# Patient Record
Sex: Female | Born: 1990
Health system: Southern US, Community
[De-identification: ages and names within clinical notes are randomized; demographics above are authoritative.]

## PROBLEM LIST (undated history)

## (undated) DIAGNOSIS — B009 Herpesviral infection, unspecified: Secondary | ICD-10-CM

## (undated) DIAGNOSIS — O4400 Placenta previa specified as without hemorrhage, unspecified trimester: Secondary | ICD-10-CM

## (undated) DIAGNOSIS — F32A Depression, unspecified: Secondary | ICD-10-CM

## (undated) DIAGNOSIS — Z789 Other specified health status: Secondary | ICD-10-CM

## (undated) HISTORY — DX: Complete placenta previa nos or without hemorrhage, unspecified trimester: O44.00

## (undated) HISTORY — DX: Herpesviral infection, unspecified: B00.9

---
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
## 2009-01-24 ENCOUNTER — Other Ambulatory Visit: Admission: RE | Admit: 2009-01-24 | Discharge: 2009-01-24 | Payer: Self-pay | Admitting: Obstetrics and Gynecology

## 2009-02-11 IMAGING — US US PELVIS COMPLETE MODIFY
1 series · 13 of 25 positions shown · non-contrast
Comparison: None.

CLINICAL DATA: Pelvic pain.  Three months postpartum. Patient has Juancho Goddard IUD. 
TRANSABDOMINAL AND TRANSVAGINAL PELVIC ULTRASOUND:
TECHNIQUE: Both transabdominal and transvaginal ultrasound examinations of the pelvis were performed including evaluation of the uterus, ovaries, adnexal regions, and pelvic cul-de-sac.

[Series 1: us pelvis complete modify · 0.24mm/px · 13 of 57 slices shown]
[im 1/57]
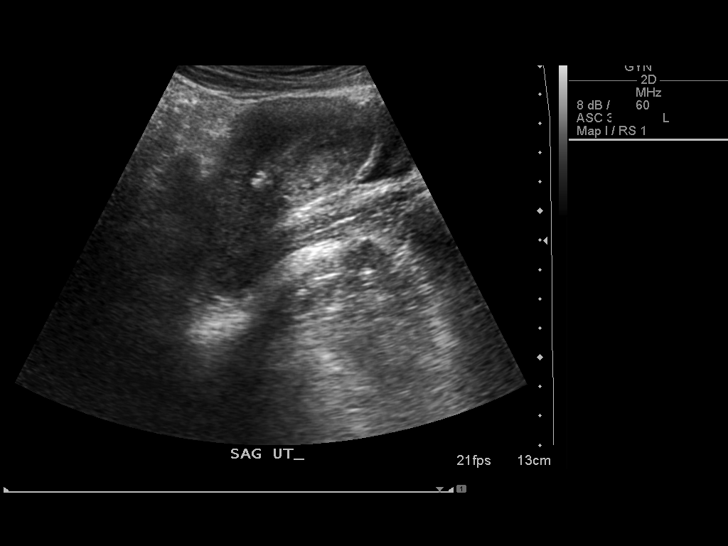
[im 5/57]
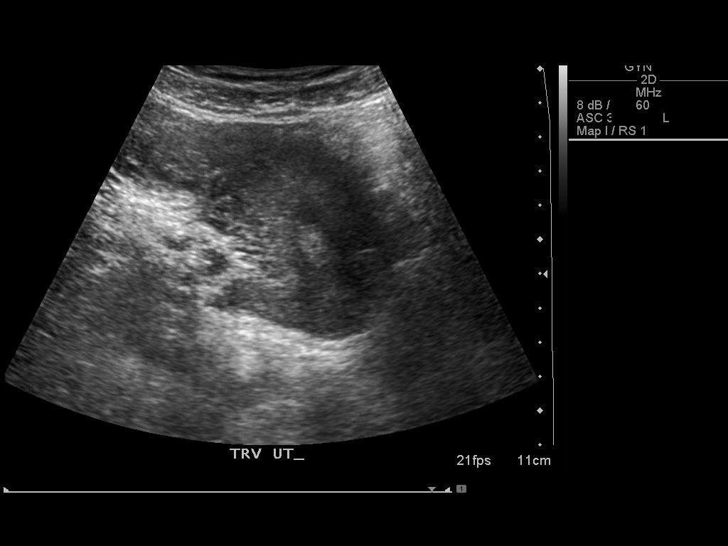
[im 10/57]
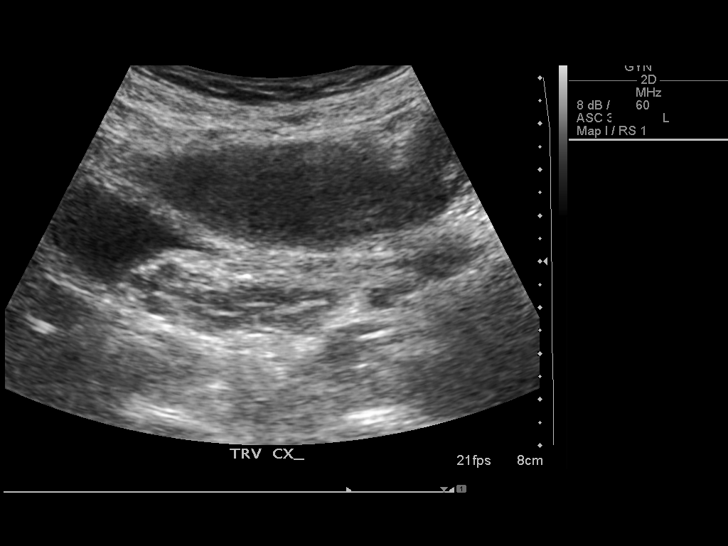
[im 15/57]
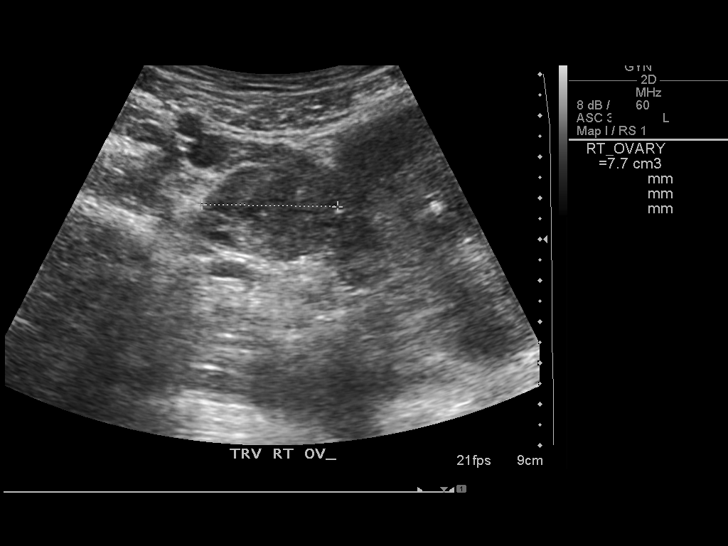
[im 19/57]
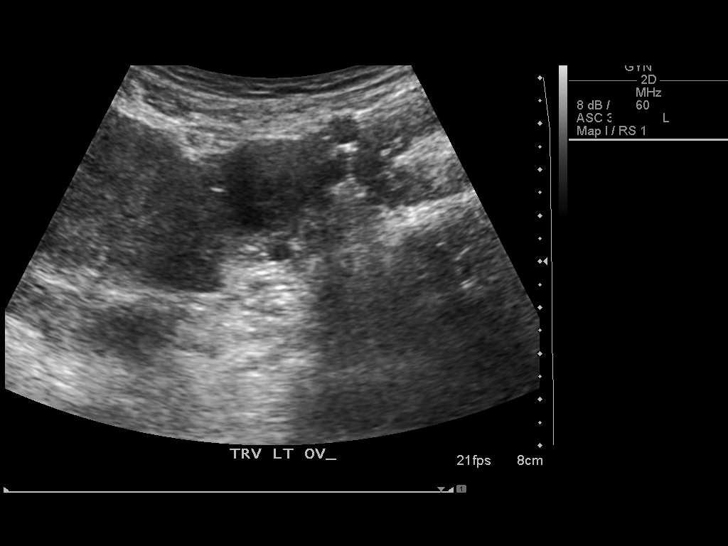
[im 24/57]
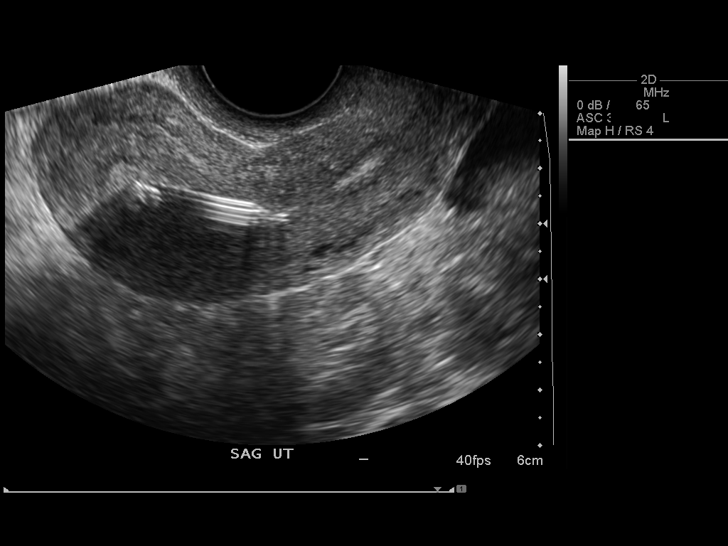
[im 29/57]
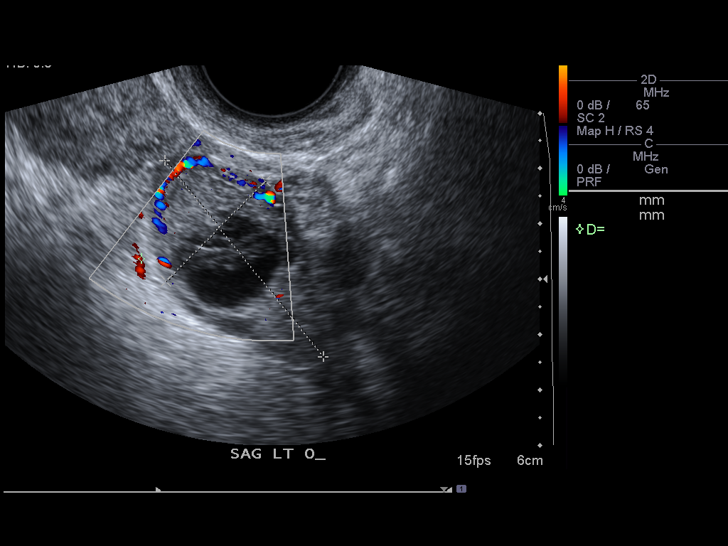
[im 33/57]
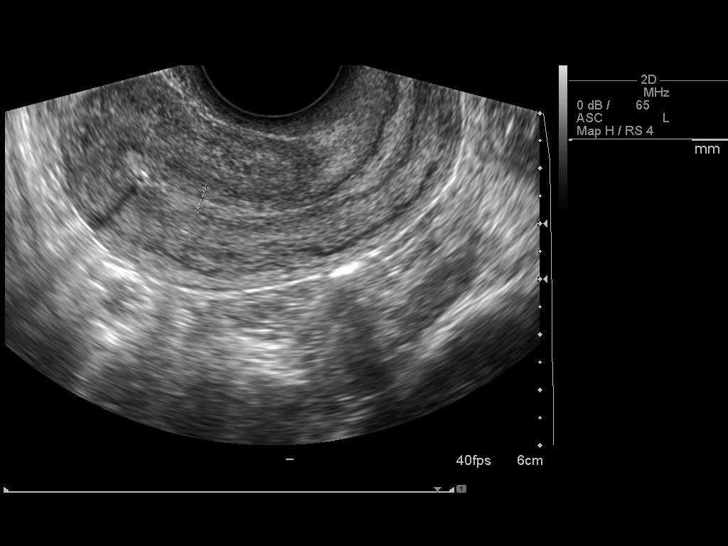
[im 38/57]
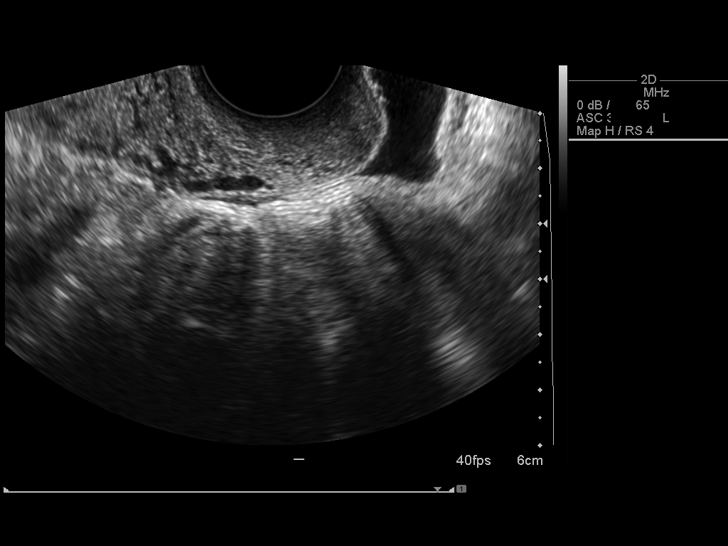
[im 43/57]
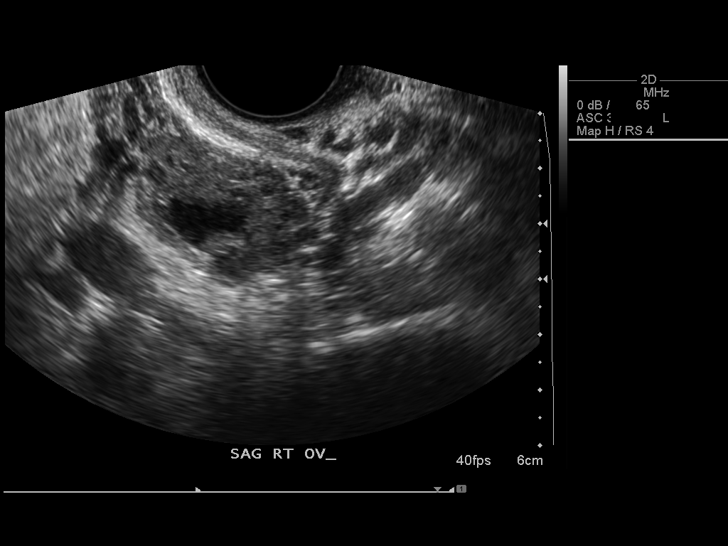
[im 47/57]
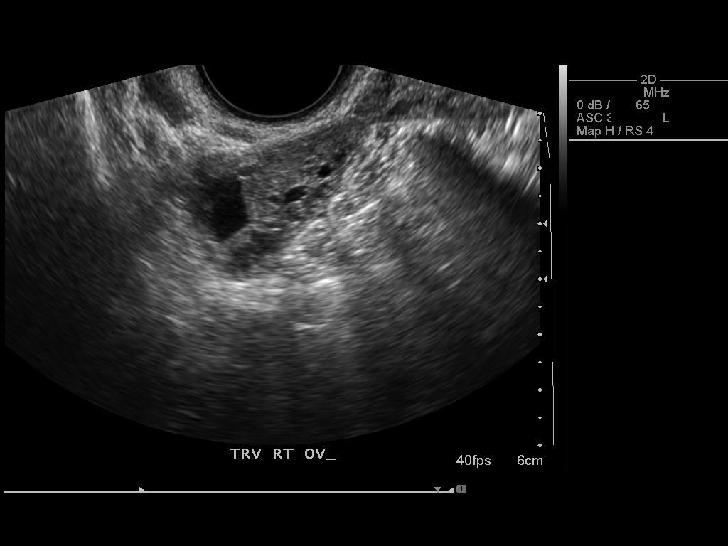
[im 52/57]
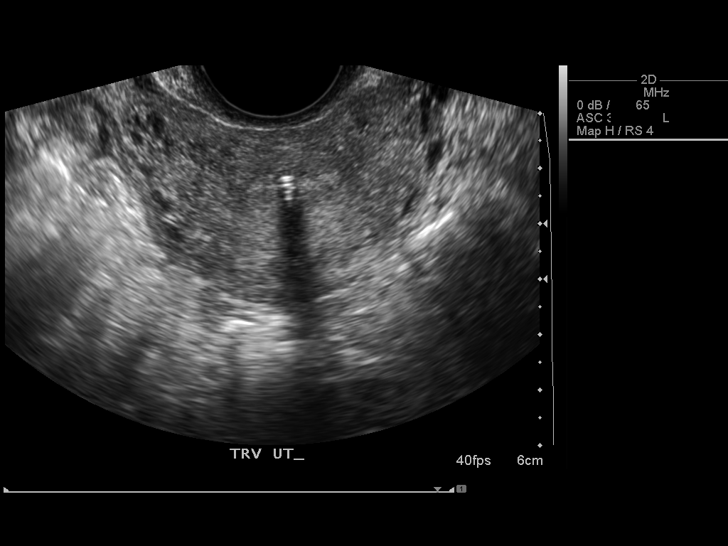
[im 57/57]
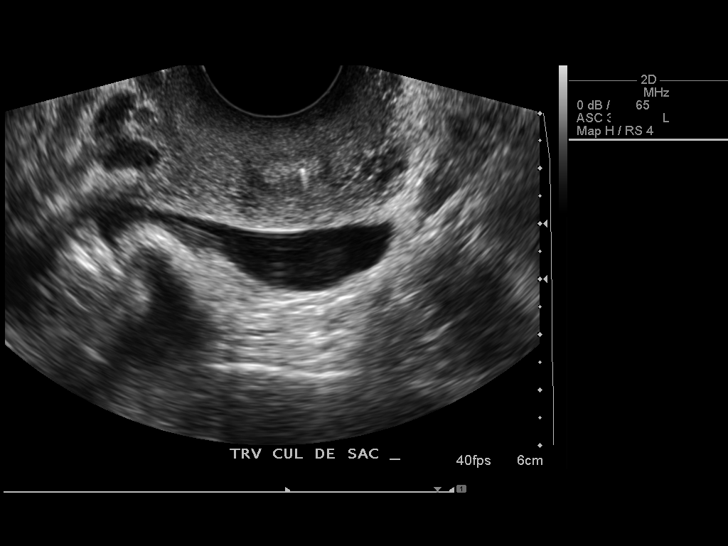

[13 of 25 positions shown; findings below may reference images not displayed]

FINDINGS: The uterus measures 8.3 cm in length x 4.4 cm AP diameter x 4.6 cm in width.  An intrauterine device is in satisfactory position in the endometrial canal.   
The endometrium measures 4 mm in greatest thickness.
The left ovary measures 4.6 x 2.6 x 2.8 cm and contains a 1.9 x 1.7 x 2.1 cm cyst with several internal Keen appearing septations, most consistent with a hemorrhagic cyst.  Doppler flow is seen within the surrounding ovarian tissue, without evidence for torsion.
The right ovary is 3.3 x 2.1 x 3.0 cm and has normal sonographic appearances. 
There is a small amount of simple appearing free fluid in the cul-de-sac.
IMPRESSION: 1.  2.1 x 1.9 x 1.7 cm hemorrhagic cyst in the left ovary may account for the patient?s pain.
2.  Small amount of free fluid in the cul-de-sac.
3.  Intrauterine device appears satisfactorily positioned within the endometrial canal.

## 2010-03-30 ENCOUNTER — Encounter: Payer: Self-pay | Admitting: Obstetrics & Gynecology
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
## 2010-07-25 NOTE — Discharge Summary (Signed)
[name], [name]            ACCOUNT NO.:  [id number]   MEDICAL RECORD NO.:  [id number]          PATIENT TYPE:  INP   LOCATION:  9122                          FACILITY:  [facility]   PHYSICIAN:  James A. Ashley Royalty, M.D.DATE OF BIRTH:  April 22, 1990   DATE OF ADMISSION:  09/10/2006  DATE OF DISCHARGE:  09/12/2006                               DISCHARGE SUMMARY   DISCHARGE DIAGNOSES:  1. Intrauterine pregnancy at 52 weeks' gestation, delivered.  2. Group B strep carrier.   OPERATIONS AND PROCEDURES:  OB delivery, episiotomy, episiorrhaphy.   CONSULTATIONS:  None.   DISCHARGE MEDICATIONS:  1. Percocet.  2. Motrin 600 mg.  3. Prenatal vitamins.   HISTORY AND PHYSICAL:  A 20 year old primigravida at 82 weeks' gestation  with O negative blood type.  The patient presented complaining of  contractions.  Initial cervical examination revealed a dilatation of 3  to 4 cm.  For the remainder of history and physical, please see chart.   HOSPITAL COURSE:  The patient was admitted to [facility].  Admission laboratory studies were drawn.  She was given  antibiotic prophylaxis versus GBS.  She went on to labor and deliver on  September 10, 2006.  The infant was a 7-pound 5-ounce female.  Apgars 8 at one  minute and 9 at five minutes, sent to the newborn nursery.  Delivery was  accomplished by Dr. Sylvester Harder over a second-degree midline  episiotomy with a Kiwi vacuum extractor secondary to inadequate  expulsive effort.  There were no complications.  The patient was  followed postpartum and was felt to be stable for discharge on September 12, 2006.  She was discharged home on that date afebrile and in satisfactory  condition.   DISPOSITION:  The patient is to return to [facility] and  Obstetrics in 4-6 weeks for postpartum evaluation.      James A. Ashley Royalty, M.D.  Electronically Signed     JAM/MEDQ  D:  11/11/2006  T:  11/11/2006  Job:  [id number]
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
## 2010-12-23 LAB — CBC
HCT: 33.7 — ABNORMAL LOW
Hemoglobin: 10.3 — ABNORMAL LOW
MCHC: 34.9
MCHC: 35
MCV: 88.7
Platelets: 208
RBC: 3.3 — ABNORMAL LOW
RDW: 13.6
WBC: 15.8 — ABNORMAL HIGH

## 2010-12-23 LAB — TYPE AND SCREEN
ABO/RH(D): O NEG
Antibody Screen: POSITIVE
Weak D: NEGATIVE

## 2011-04-04 ENCOUNTER — Encounter (HOSPITAL_COMMUNITY): Payer: Self-pay

## 2011-04-04 ENCOUNTER — Emergency Department (HOSPITAL_COMMUNITY)
Admission: EM | Admit: 2011-04-04 | Discharge: 2011-04-04 | Disposition: A | Discharge status: Home / Self Care | Payer: BC Managed Care – PPO | Attending: Emergency Medicine | Admitting: Emergency Medicine

## 2011-04-04 DIAGNOSIS — N390 Urinary tract infection, site not specified: Secondary | ICD-10-CM

## 2011-04-04 DIAGNOSIS — A6000 Herpesviral infection of urogenital system, unspecified: Secondary | ICD-10-CM | POA: Insufficient documentation

## 2011-04-04 DIAGNOSIS — F17200 Nicotine dependence, unspecified, uncomplicated: Secondary | ICD-10-CM | POA: Insufficient documentation

## 2011-04-04 HISTORY — DX: Herpesviral infection, unspecified: B00.9

## 2011-04-04 LAB — URINALYSIS, ROUTINE W REFLEX MICROSCOPIC
Glucose, UA: 100 mg/dL — AB
Nitrite: POSITIVE — AB
Protein, ur: 100 mg/dL — AB
Specific Gravity, Urine: >1.03 — ABNORMAL HIGH (ref 1.005–1.030)
Urobilinogen, UA: >8 mg/dL — ABNORMAL HIGH (ref 0.0–1.0)
pH: 5 (ref 5.0–8.0)

## 2011-04-04 LAB — WET PREP, GENITAL: Yeast Wet Prep HPF POC: NONE SEEN

## 2011-04-04 LAB — URINE MICROSCOPIC-ADD ON

## 2011-04-04 LAB — POCT PREGNANCY, URINE: Preg Test, Ur: NEGATIVE

## 2011-04-04 MED ORDER — NITROFURANTOIN MONOHYD MACRO 100 MG PO CAPS: 100.0000 mg | ORAL_CAPSULE | Freq: Two times a day (BID) | ORAL | Status: AC

## 2011-04-04 MED ORDER — VALACYCLOVIR HCL 1 G PO TABS: 1000.0000 mg | ORAL_TABLET | Freq: Two times a day (BID) | ORAL | Status: DC

## 2011-04-04 NOTE — ED Provider Notes (Signed)
History     CSN: [id number]  Arrival date & time 04/04/11  1023   First MD Initiated Contact with Patient 04/04/11 1034      Chief Complaint  Patient presents with  . Urinary Tract Infection    (Consider location/radiation/quality/duration/timing/severity/associated sxs/prior treatment) HPI Comments: Patient with a history of herpes simplex presents with 3 day history of dysuria and pain - states when she was intiially diagnosed her lesions were mainly on the inside - has run out of her valtrex - is unsure if this is recurrence of herpes or an UTI  Patient is a 21 y.o. female presenting with female genitourinary complaint. The history is provided by the patient. No language interpreter was used.  Female GU Problem Primary symptoms include genital lesions, genital pain and dysuria.  Primary symptoms include no discharge, no pelvic pain, no dyspareunia, no genital rash, no genital itching, no genital odor and no vaginal bleeding. There has been no fever. This is a recurrent problem. The current episode started more than 1 week ago. The problem occurs constantly. The problem has been gradually worsening. The symptoms occur during urination and at rest. She is not pregnant. She has not missed her period. Her LMP was days ago. The patient's menstrual history has been regular. The discharge was clear. Pertinent negatives include no anorexia, no diaphoresis, no abdominal swelling, no abdominal pain, no constipation, no diarrhea, no nausea, no vomiting, no frequency, no light-headedness and no dizziness. She has tried nothing for the symptoms. The treatment provided no relief. Sexual activity: sexually active. There is a concern regarding sexually transmitted diseases. She uses oral contraceptives for contraception. Associated medical issues include Herpes simplex.    Past Medical History  Diagnosis Date  . Herpes     History reviewed. No pertinent past surgical history.  No family history on  file.  History  Substance Use Topics  . Smoking status: Current Everyday Smoker  . Smokeless tobacco: Not on file  . Alcohol Use: Yes     occasionally    OB History    Grav Para Term Preterm Abortions TAB SAB Ect Mult Living                  Review of Systems  Constitutional: Negative for diaphoresis.  Gastrointestinal: Negative for nausea, vomiting, abdominal pain, diarrhea, constipation and anorexia.  Genitourinary: Positive for dysuria, hematuria and genital sores. Negative for frequency, flank pain, decreased urine volume, vaginal bleeding, vaginal pain, pelvic pain and dyspareunia.  Neurological: Negative for dizziness and light-headedness.  All other systems reviewed and are negative.    Allergies  Penicillins  Home Medications  No current outpatient prescriptions on file.  BP 129/67  Pulse 106  Temp(Src) 98.2 F (36.8 C) (Oral)  Resp 20  Ht 5\' 5"  (1.651 m)  Wt 124 lb (56.246 kg)  BMI 20.63 kg/m2  SpO2 100%  LMP 03/14/2011  Physical Exam  Nursing note and vitals reviewed. Constitutional: She is oriented to person, place, and time. She appears well-developed and well-nourished. No distress.  HENT:  Head: Normocephalic and atraumatic.  Right Ear: External ear normal.  Left Ear: External ear normal.  Nose: Nose normal.  Mouth/Throat: Oropharynx is clear and moist. No oropharyngeal exudate.  Eyes: Conjunctivae are normal. Pupils are equal, round, and reactive to light. No scleral icterus.  Neck: Normal range of motion. Neck supple. No thyromegaly present.  Cardiovascular: Normal rate, regular rhythm and normal heart sounds.  Exam reveals no gallop and no friction rub.  No murmur heard. Pulmonary/Chest: Effort normal and breath sounds normal. She exhibits no tenderness.  Abdominal: Soft. Bowel sounds are normal. She exhibits no distension. There is no tenderness.  Genitourinary: Vagina normal and uterus normal. Pelvic exam was performed with patient supine.  There is no rash, tenderness or lesion on the right labia. There is no rash, tenderness or lesion on the left labia. Uterus is not tender. Cervix exhibits friability. Cervix exhibits no motion tenderness and no discharge. Right adnexum displays no mass and no tenderness. Left adnexum displays no mass and no tenderness. No erythema or tenderness around the vagina. No vaginal discharge found.    Musculoskeletal: Normal range of motion.  Lymphadenopathy:    She has no cervical adenopathy.  Neurological: She is alert and oriented to person, place, and time.  Skin: Skin is warm and dry. No rash noted. No erythema. No pallor.  Psychiatric: She has a normal mood and affect. Her behavior is normal. Judgment and thought content normal.    ED Course  Procedures (including critical care time)   Labs Reviewed  POCT PREGNANCY, URINE  URINALYSIS, ROUTINE W REFLEX MICROSCOPIC  GC/CHLAMYDIA PROBE AMP, GENITAL  WET PREP, GENITAL   No results found.  Results for orders placed during the hospital encounter of 04/04/11  URINALYSIS, ROUTINE W REFLEX MICROSCOPIC      Component Value Range   Color, Urine RED (*) YELLOW    APPearance HAZY (*) CLEAR    Specific Gravity, Urine >1.030 (*) 1.005 - 1.030    pH 5.0  5.0 - 8.0    Glucose, UA 100 (*) NEGATIVE (mg/dL)   Hgb urine dipstick LARGE (*) NEGATIVE    Bilirubin Urine MODERATE (*) NEGATIVE    Ketones, ur TRACE (*) NEGATIVE (mg/dL)   Protein, ur 161 (*) NEGATIVE (mg/dL)   Urobilinogen, UA >0.9 (*) 0.0 - 1.0 (mg/dL)   Nitrite POSITIVE (*) NEGATIVE    Leukocytes, UA TRACE (*) NEGATIVE   POCT PREGNANCY, URINE      Component Value Range   Preg Test, Ur NEGATIVE  NEGATIVE   URINE MICROSCOPIC-ADD ON      Component Value Range   Squamous Epithelial / LPF FEW (*) RARE    WBC, UA 21-50  <3 (WBC/hpf)   RBC / HPF TOO NUMEROUS TO COUNT  <3 (RBC/hpf)   Bacteria, UA MANY (*) RARE    No results found.  Herpes Simplex UTI    MDM  Patient with history  of HS2 with recurrence of same - not on antiviral medication at this time - also with UTI        Scarlette Calico C. [place], [place] 04/04/11 1135

## 2011-04-04 NOTE — ED Notes (Signed)
Pt says thinks has a UTI.  Pt says found out in July that she has Herpes simplex.  Pt says Health dept only gave her 4 months supply of acycolvir and ran out 2 weeks ago.  Pt says she does better with valtrex.   Pt c/o lower back pain and burning with urination x 3 days.

## 2011-04-06 LAB — URINE CULTURE
Colony Count: >=100000
Culture  Setup Time: 201301262056
Special Requests: NORMAL

## 2011-04-06 LAB — GC/CHLAMYDIA PROBE AMP, GENITAL: Chlamydia, DNA Probe: NEGATIVE

## 2011-04-06 NOTE — ED Provider Notes (Signed)
Medical screening examination/treatment/procedure(s) were performed by non-physician practitioner and as supervising physician I was immediately available for consultation/collaboration.  Davontay Watlington, MD 04/06/11 1046 

## 2020-03-09 NOTE — L&D Delivery Note (Signed)
       Delivery Note   [name] is a 30 y.o. G3P2002 at [redacted]w[redacted]d Estimated Date of Delivery: 02/17/21  PRE-OPERATIVE DIAGNOSIS:  1) [redacted]w[redacted]d pregnancy.    POST-OPERATIVE DIAGNOSIS:  1) [redacted]w[redacted]d pregnancy s/p Vaginal, Spontaneous  2) Viable female infant  Delivery Type: Vaginal, Spontaneous    Delivery Anesthesia: Epidural   Labor Complications:      ESTIMATED BLOOD LOSS: 2mL   FINDINGS:   1) female infant, Apgar scores of    at 1 minute and    at 5 minutes and a birthweight of   ounces.    2) Nuchal cord: No  SPECIMENS:   PLACENTA:   Appearance: Intact    Removal: Spontaneous      Disposition:    DISPOSITION:  Infant to left in stable condition in the delivery room, with L&D personnel and mother,  NARRATIVE SUMMARY: Labor course:  [name] is a [id number] at [redacted]w[redacted]d who presented for induction of labor. She received Misoprostol and underwent AROM.  She began contracting regularly and rapidly progressed.  She received the appropriate anesthesia and proceeded to complete dilation. She evidenced good maternal expulsive effort during the second stage. She went on to deliver a viable infant. The placenta delivered without problems and was noted to be complete. A perineal and vaginal examination was performed. Episiotomy/Lacerations:  None  Elonda Husky, M.D. 02/10/2021 2:41 PM

## 2020-06-24 ENCOUNTER — Encounter: Payer: Self-pay | Admitting: Obstetrics and Gynecology

## 2020-06-24 ENCOUNTER — Other Ambulatory Visit: Payer: Self-pay

## 2020-06-24 ENCOUNTER — Ambulatory Visit (INDEPENDENT_AMBULATORY_CARE_PROVIDER_SITE_OTHER): Payer: Medicaid Other | Admitting: Obstetrics and Gynecology

## 2020-06-24 VITALS — BP 116/66 | HR 74 | Ht 65.0 in | Wt 128.0 lb

## 2020-06-24 DIAGNOSIS — Z3200 Encounter for pregnancy test, result unknown: Secondary | ICD-10-CM | POA: Diagnosis not present

## 2020-06-24 DIAGNOSIS — N912 Amenorrhea, unspecified: Secondary | ICD-10-CM | POA: Diagnosis not present

## 2020-06-24 LAB — POCT URINE PREGNANCY: Preg Test, Ur: POSITIVE — AB

## 2020-06-24 NOTE — Progress Notes (Signed)
HPI:      [name] is a 30 y.o. [id number] who LMP was Patient's last menstrual period was 05/23/2020.  Subjective:   She presents today with complaint of amenorrhea.  Based on last menstrual period she is approximately 4 weeks estimated gestational age.  She has occasional nausea and vomiting.  She was not attempting pregnancy but not preventing.  She is not yet taking prenatal vitamins. Her 2 prior pregnancies were vaginal deliveries.  Her second pregnancy was complicated by placenta previa but this resolved by delivery.    Hx: The following portions of the patient's history were reviewed and updated as appropriate:             She  has a past medical history of HSV (herpes simplex virus) infection and Placenta previa. She does not have a problem list on file. She  has a past surgical history that includes Facial reconstruction surgery (2014). Her family history includes Dementia in her maternal grandfather; Diabetes in her paternal grandmother; Hypertension in her mother. She  reports that she has never smoked. She has never used smokeless tobacco. She reports previous alcohol use. She reports current drug use. Drug: Marijuana. She currently has no medications in their medication list. She is allergic to hydrocodone.       Review of Systems:  Review of Systems  Constitutional: Denied constitutional symptoms, night sweats, recent illness, fatigue, fever, insomnia and weight loss.  Eyes: Denied eye symptoms, eye pain, photophobia, vision change and visual disturbance.  Ears/Nose/Throat/Neck: Denied ear, nose, throat or neck symptoms, hearing loss, nasal discharge, sinus congestion and sore throat.  Cardiovascular: Denied cardiovascular symptoms, arrhythmia, chest pain/pressure, edema, exercise intolerance, orthopnea and palpitations.  Respiratory: Denied pulmonary symptoms, asthma, pleuritic pain, productive sputum, cough, dyspnea and wheezing.  Gastrointestinal: Denied,  gastro-esophageal reflux, melena, nausea and vomiting.  Genitourinary: Denied genitourinary symptoms including symptomatic vaginal discharge, pelvic relaxation issues, and urinary complaints.  Musculoskeletal: Denied musculoskeletal symptoms, stiffness, swelling, muscle weakness and myalgia.  Dermatologic: Denied dermatology symptoms, rash and scar.  Neurologic: Denied neurology symptoms, dizziness, headache, neck pain and syncope.  Psychiatric: Denied psychiatric symptoms, anxiety and depression.  Endocrine: Denied endocrine symptoms including hot flashes and night sweats.   Meds:   No current outpatient medications on file prior to visit.   No current facility-administered medications on file prior to visit.          Objective:     Vitals:   06/24/20 1056  BP: 116/66  Pulse: 74   Filed Weights   06/24/20 1056  Weight: 128 lb (58.1 kg)              Urinary beta-hCG positive  Assessment:    G3P2002 There are no problems to display for this patient.    1. Amenorrhea   2. Possible pregnancy, not confirmed     Approximately 4 weeks estimated gestational age based on LMP -patient not exactly sure of LMP.   Plan:            Prenatal Plan 1.  The patient was given prenatal literature. 2.  She was started on prenatal vitamins. 3.  A prenatal lab panel to be drawn at nurse visit. 4.  An ultrasound was ordered to better determine an EDC. 5.  A nurse visit was scheduled. 6.  Genetic testing and testing for other inheritable conditions discussed in detail. She will decide in the future whether to have these labs performed. 7.  A general overview of pregnancy testing,  visit schedule, ultrasound schedule, and prenatal care was discussed. 8.  COVID and its risks associated with pregnancy, prevention by limiting exposure and use of masks, as well as the risks and benefits of vaccination during pregnancy were discussed in detail.  [facility] policy regarding office and hospital  visitation and testing was explained. 9.  Benefits of breast-feeding discussed in detail including both maternal and infant benefits. Ready Set Baby website discussed.   Orders Orders Placed This Encounter  Procedures  . US OB Comp Less 14 Wks  . POCT urine pregnancy    No orders of the defined types were placed in this encounter.     F/U  Return in about 5 weeks (around 07/29/2020). I spent 32 minutes involved in the care of this patient preparing to see the patient by obtaining and reviewing her medical history (including labs, imaging tests and prior procedures), documenting clinical information in the electronic health record (EHR), counseling and coordinating care plans, writing and sending prescriptions, ordering tests or procedures and directly communicating with the patient by discussing pertinent items from her history and physical exam as well as detailing my assessment and plan as noted above so that she has an informed understanding.  All of her questions were answered.  Elonda Husky, M.D. 06/24/2020 11:21 AM

## 2020-07-03 ENCOUNTER — Telehealth: Payer: Self-pay | Admitting: Certified Nurse Midwife

## 2020-07-03 MED ORDER — DOXYLAMINE-PYRIDOXINE 10-10 MG PO TBEC: 10.0000 mg | DELAYED_RELEASE_TABLET | Freq: Every day | ORAL | 1 refills | Status: DC

## 2020-07-03 NOTE — Telephone Encounter (Signed)
Pt states she has been having n/v for several days. She can drink h20 and sprite.   Diclegis erxed. Pt aware to stay hydrated. Small frequent meals.   Pt voiced understanding.

## 2020-07-03 NOTE — Telephone Encounter (Signed)
New Message:  Pt states that she is sick and hasn't been able to keep any food down since last Thursday.  She states that she lost her sister that day.  She states that she has no strength to get out of bed and is due to go back to work Friday.

## 2020-07-04 ENCOUNTER — Telehealth: Payer: Self-pay | Admitting: Certified Nurse Midwife

## 2020-07-04 NOTE — Telephone Encounter (Signed)
[facility] has pts rx. Pharmacy needs pts insurance. Pt is aware.

## 2020-07-04 NOTE — Telephone Encounter (Signed)
[name] called and states that she went to the pharmacy to pick up her doxylamine-pyridoxine and the pharmacy states they never received a script from [place].  Patient states she showed them her mychart notification that it was sent in and they keep telling her it wasn't.  [employer] states they needed someone from our office to call them at [phone]

## 2020-07-26 ENCOUNTER — Telehealth: Payer: Self-pay | Admitting: Certified Nurse Midwife

## 2020-07-26 NOTE — Telephone Encounter (Signed)
VM not set up yet.

## 2020-07-26 NOTE — Telephone Encounter (Signed)
Spoke with pt and she stated that she has been taking the medication for nausea and is still very sick.  She wants to know if there is anything else she can try.

## 2020-07-29 ENCOUNTER — Encounter: Payer: Medicaid Other | Admitting: Certified Nurse Midwife

## 2020-07-29 ENCOUNTER — Other Ambulatory Visit: Payer: Self-pay

## 2020-07-29 ENCOUNTER — Ambulatory Visit (INDEPENDENT_AMBULATORY_CARE_PROVIDER_SITE_OTHER): Payer: Medicaid Other

## 2020-07-29 VITALS — BP 109/74 | HR 80 | Ht 65.0 in | Wt 123.5 lb

## 2020-07-29 DIAGNOSIS — Z3491 Encounter for supervision of normal pregnancy, unspecified, first trimester: Secondary | ICD-10-CM | POA: Diagnosis not present

## 2020-07-29 DIAGNOSIS — Z3481 Encounter for supervision of other normal pregnancy, first trimester: Secondary | ICD-10-CM

## 2020-07-29 DIAGNOSIS — Z113 Encounter for screening for infections with a predominantly sexual mode of transmission: Secondary | ICD-10-CM

## 2020-07-29 DIAGNOSIS — Z0283 Encounter for blood-alcohol and blood-drug test: Secondary | ICD-10-CM | POA: Diagnosis not present

## 2020-07-29 LAB — OB RESULTS CONSOLE GC/CHLAMYDIA: Gonorrhea: NEGATIVE

## 2020-07-29 LAB — OB RESULTS CONSOLE VARICELLA ZOSTER ANTIBODY, IGG: Varicella: IMMUNE

## 2020-07-29 MED ORDER — VITAFOL GUMMIES 3.33-0.333-34.8 MG PO CHEW: 3.0000 | CHEWABLE_TABLET | Freq: Every day | ORAL | 11 refills | Status: DC

## 2020-07-29 MED ORDER — ONDANSETRON HCL 4 MG PO TABS: 4.0000 mg | ORAL_TABLET | Freq: Three times a day (TID) | ORAL | 0 refills | Status: DC | PRN

## 2020-07-29 NOTE — Progress Notes (Signed)
      [name] presents for NOB nurse intake visit. Pregnancy confirmation done at [facility], 06/24/2020, with Linzie Collin, MD.  G 3.  P 2002.  LMP 05/23/2020.  EDD 02/27/2021.  [place] [redacted]w[redacted]d . Pregnancy education material explained and given.  0 cats in the home.  NOB labs ordered. BMI less than 30. TSH/HbgA1c not ordered. Sickle cell not ordered due to race. HIV and drug screen explained and ordered. Genetic screening discussed. Genetic testing;wants genetic testing. Pt to discuss genetic testing with provider. Will complete doing NOB PE with provider.  PNV encouraged. Pt to follow up with provider in  3 weeks for NOB physical. FMLA/[employer] Financial Policy/HIV/Drug screening forms all reviewed and signed by patient.

## 2020-07-29 NOTE — Patient Instructions (Signed)
WHAT OB PATIENTS CAN EXPECT   Confirmation of pregnancy and ultrasound ordered if medically indicated-[redacted] weeks gestation  New OB (NOB) intake with nurse and New OB (NOB) labs- [redacted] weeks gestation  New OB (NOB) physical examination with provider- 11/[redacted] weeks gestation  Flu vaccine-[redacted] weeks gestation  Anatomy scan-[redacted] weeks gestation  Glucose tolerance test, blood work to test for anemia, T-dap vaccine-[redacted] weeks gestation  Vaginal swabs/cultures-STD/Group B strep-[redacted] weeks gestation  Appointments every 4 weeks until 28 weeks  Every 2 weeks from 28 weeks until 36 weeks  Weekly visits from 36 weeks until delivery  How a Baby Grows During Pregnancy Pregnancy begins when a female's sperm enters a female's egg. This is called fertilization. Fertilization usually happens in one of the fallopian tubes that connect the ovaries to the uterus. The fertilized egg moves down the fallopian tube to the uterus. Once it reaches the uterus, it implants into the lining of the uterus and begins to grow. For the first 8 weeks, the fertilized egg is called an embryo. After 8 weeks, it is called a fetus. As the fetus continues to grow, it receives oxygen and nutrients through the placenta, which is an organ that grows to support the developing baby. The placenta is the life support system for the baby. It provides oxygen and nutrition and removes waste. How long does a typical pregnancy last? A pregnancy usually lasts 280 days, or about 40 weeks. Pregnancy is divided into three periods of growth, also called trimesters:  First trimester: 0-12 weeks.  Second trimester: 13-27 weeks.  Third trimester: 28-40 weeks. The day when your baby is ready to be born (full term) is your estimated date of delivery. However, most babies are not born on their estimated date of delivery. How does my baby develop month by month? First month  The fertilized egg attaches to the inside of the uterus.  Some cells will form the  placenta. Others will form the fetus.  The arms, legs, brain, spinal cord, lungs, and heart begin to develop.  At the end of the first month, the heart begins to beat. Second month  The bones, inner ear, eyelids, hands, and feet form.  The genitals develop.  By the end of 8 weeks, all major organs are developing. Third month  All of the internal organs are forming.  Teeth develop below the gums.  Bones and muscles begin to grow. The spine can flex.  The skin is transparent.  Fingernails and toenails begin to form.  Arms and legs continue to grow longer, and hands and feet develop.  The fetus is about 3 inches (7.6 cm) long. Fourth month  The placenta is completely formed.  The external sex organs, neck, outer ear, eyebrows, eyelids, and fingernails are formed.  The fetus can hear, swallow, and move its arms and legs.  The kidneys begin to produce urine.  The skin is covered with a white, waxy coating (vernix) and very fine hair (lanugo). Fifth month  The fetus moves around more and can be felt for the first time (quickening).  The fetus starts to sleep and wake up and may begin to suck a finger.  The nails grow to the end of the fingers.  The organ in the digestive system that makes bile (gallbladder) functions and helps to digest nutrients.  If the fetus is a female, eggs are present in the ovaries. If the fetus is a female, testicles start to move down into the scrotum. Sixth month  The  lungs are formed.  The eyes open. The brain continues to develop.  Your baby has fingerprints and toe prints. Your baby's hair grows thicker.  At the end of the second trimester, the fetus is about 9 inches (22.9 cm) long. Seventh month  The fetus kicks and stretches.  The eyes are developed enough to sense changes in light.  The hands can make a grasping motion.  The fetus responds to sound. Eighth month  Most organs and body systems are fully developed and  functioning.  Bones harden, and taste buds develop. The fetus may hiccup.  Certain areas of the brain are still developing. The skull remains soft. Ninth month  The fetus gains about  lb (0.23 kg) each week.  The lungs are fully developed.  Patterns of sleep develop.  The fetus's head typically moves into a head-down position (vertex) in the uterus to prepare for birth.  The fetus weighs 6-9 lb (2.72-4.08 kg) and is 19-20 inches (48.26-50.8 cm) long.   How do I know if my baby is developing well? Always talk with your health care provider about any concerns that you may have about your pregnancy and your baby. At each prenatal visit, your health care provider will do several different tests to check on your health and keep track of your baby's development. These include:  Fundal height and position. To do this, your health care provider will: ? Measure your growing belly from your pubic bone to the top of the uterus using a tape measure. ? Feel your belly to determine your baby's position.  Heartbeat. An ultrasound in the first trimester can confirm pregnancy and show a heartbeat, depending on how far along you are. Your health care provider will check your baby's heart rate at every prenatal visit. You will also have a second trimester ultrasound to check your baby's development. Follow these instructions at home:  Take prenatal vitamins as told by your health care provider. These include vitamins such as folic acid, iron, calcium, and vitamin D. They are important for healthy development.  Take over-the-counter and prescription medicines only as told by your health care provider.  Keep all follow-up visits. This is important. Follow-up visits include prenatal care and screening tests. Summary  A pregnancy usually lasts 280 days, or about 40 weeks. Pregnancy is divided into three periods of growth, also called trimesters.  Your health care provider will monitor your baby's  growth and development throughout your pregnancy.  Follow your health care provider's recommendations about taking prenatal vitamins and medicines during your pregnancy.  Talk with your health care provider if you have any concerns about your pregnancy or your developing baby. This information is not intended to replace advice given to you by your health care provider. Make sure you discuss any questions you have with your health care provider. Document Revised: 08/02/2019 Document Reviewed: 06/08/2019 Elsevier Patient Education  2021 Elsevier Inc. https://www.cdc.gov/pregnancy/infections.html">  First Trimester of Pregnancy  The first trimester of pregnancy starts on the first day of your last menstrual period until the end of week 12. This is also called months 1 through 3 of pregnancy. Body changes during your first trimester Your body goes through many changes during pregnancy. The changes usually return to normal after your baby is born. Physical changes  You may gain or lose weight.  Your breasts may grow larger and hurt. The area around your nipples may get darker.  Dark spots or blotches may develop on your face.  You may   have changes in your hair. Health changes  You may feel like you might vomit (nauseous), and you may vomit.  You may have heartburn.  You may have headaches.  You may have trouble pooping (constipation).  Your gums may bleed. Other changes  You may get tired easily.  You may pee (urinate) more often.  Your menstrual periods will stop.  You may not feel hungry.  You may want to eat certain kinds of food.  You may have changes in your emotions from day to day.  You may have more dreams. Follow these instructions at home: Medicines  Take over-the-counter and prescription medicines only as told by your doctor. Some medicines are not safe during pregnancy.  Take a prenatal vitamin that contains at least 600 micrograms (mcg) of folic  acid. Eating and drinking  Eat healthy meals that include: ? Fresh fruits and vegetables. ? Whole grains. ? Good sources of protein, such as meat, eggs, or tofu. ? Low-fat dairy products.  Avoid raw meat and unpasteurized juice, milk, and cheese.  If you feel like you may vomit, or you vomit: ? Eat 4 or 5 small meals a day instead of 3 large meals. ? Try eating a few soda crackers. ? Drink liquids between meals instead of during meals.  You may need to take these actions to prevent or treat trouble pooping: ? Drink enough fluids to keep your pee (urine) pale yellow. ? Eat foods that are high in fiber. These include beans, whole grains, and fresh fruits and vegetables. ? Limit foods that are high in fat and sugar. These include fried or sweet foods. Activity  Exercise only as told by your doctor. Most people can do their usual exercise routine during pregnancy.  Stop exercising if you have cramps or pain in your lower belly (abdomen) or low back.  Do not exercise if it is too hot or too humid, or if you are in a place of great height (high altitude).  Avoid heavy lifting.  If you choose to, you may have sex unless your doctor tells you not to. Relieving pain and discomfort  Wear a good support bra if your breasts are sore.  Rest with your legs raised (elevated) if you have leg cramps or low back pain.  If you have bulging veins (varicose veins) in your legs: ? Wear support hose as told by your doctor. ? Raise your feet for 15 minutes, 3-4 times a day. ? Limit salt in your food. Safety  Wear your seat belt at all times when you are in a car.  Talk with your doctor if someone is hurting you or yelling at you.  Talk with your doctor if you are feeling sad or have thoughts of hurting yourself. Lifestyle  Do not use hot tubs, steam rooms, or saunas.  Do not douche. Do not use tampons or scented sanitary pads.  Do not use herbal medicines, illegal drugs, or medicines  that are not approved by your doctor. Do not drink alcohol.  Do not smoke or use any products that contain nicotine or tobacco. If you need help quitting, ask your doctor.  Avoid cat litter boxes and soil that is used by cats. These carry germs that can cause harm to the baby and can cause a loss of your baby by miscarriage or stillbirth. General instructions  Keep all follow-up visits. This is important.  Ask for help if you need counseling or if you need help with nutrition. Your doctor   can give you advice or tell you where to go for help.  Visit your dentist. At home, brush your teeth with a soft toothbrush. Floss gently.  Write down your questions. Take them to your prenatal visits. Where to find more information  American Pregnancy Association: americanpregnancy.org  American College of Obstetricians and Gynecologists: www.acog.org  Office on Women's Health: womenshealth.gov/pregnancy Contact a doctor if:  You are dizzy.  You have a fever.  You have mild cramps or pressure in your lower belly.  You have a nagging pain in your belly area.  You continue to feel like you may vomit, you vomit, or you have watery poop (diarrhea) for 24 hours or longer.  You have a bad-smelling fluid coming from your vagina.  You have pain when you pee.  You are exposed to a disease that spreads from person to person, such as chickenpox, measles, Zika virus, HIV, or hepatitis. Get help right away if:  You have spotting or bleeding from your vagina.  You have very bad belly cramping or pain.  You have shortness of breath or chest pain.  You have any kind of injury, such as from a fall or a car crash.  You have new or increased pain, swelling, or redness in an arm or leg. Summary  The first trimester of pregnancy starts on the first day of your last menstrual period until the end of week 12 (months 1 through 3).  Eat 4 or 5 small meals a day instead of 3 large meals.  Do not smoke  or use any products that contain nicotine or tobacco. If you need help quitting, ask your doctor.  Keep all follow-up visits. This information is not intended to replace advice given to you by your health care provider. Make sure you discuss any questions you have with your health care provider. Document Revised: 08/02/2019 Document Reviewed: 06/08/2019 Elsevier Patient Education  2021 Elsevier Inc. Commonly Asked Questions During Pregnancy  Cats: A parasite can be excreted in cat feces.  To avoid exposure you need to have another person empty the little box.  If you must empty the litter box you will need to wear gloves.  Wash your hands after handling your cat.  This parasite can also be found in raw or undercooked meat so this should also be avoided.  Colds, Sore Throats, Flu: Please check your medication sheet to see what you can take for symptoms.  If your symptoms are unrelieved by these medications please call the office.  Dental Work: Most any dental work your dentist recommends is permitted.  X-rays should only be taken during the first trimester if absolutely necessary.  Your abdomen should be shielded with a lead apron during all x-rays.  Please notify your provider prior to receiving any x-rays.  Novocaine is fine; gas is not recommended.  If your dentist requires a note from us prior to dental work please call the office and we will provide one for you.  Exercise: Exercise is an important part of staying healthy during your pregnancy.  You may continue most exercises you were accustomed to prior to pregnancy.  Later in your pregnancy you will most likely notice you have difficulty with activities requiring balance like riding a bicycle.  It is important that you listen to your body and avoid activities that put you at a higher risk of falling.  Adequate rest and staying well hydrated are a must!  If you have questions about the safety of specific activities   ask your provider.     Exposure to Children with illness: Try to avoid obvious exposure; report any symptoms to us when noted,  If you have chicken pos, red measles or mumps, you should be immune to these diseases.   Please do not take any vaccines while pregnant unless you have checked with your OB provider.  Fetal Movement: After 28 weeks we recommend you do "kick counts" twice daily.  Lie or sit down in a calm quiet environment and count your baby movements "kicks".  You should feel your baby at least 10 times per hour.  If you have not felt 10 kicks within the first hour get up, walk around and have something sweet to eat or drink then repeat for an additional hour.  If count remains less than 10 per hour notify your provider.  Fumigating: Follow your pest control agent's advice as to how long to stay out of your home.  Ventilate the area well before re-entering.  Hemorrhoids:   Most over-the-counter preparations can be used during pregnancy.  Check your medication to see what is safe to use.  It is important to use a stool softener or fiber in your diet and to drink lots of liquids.  If hemorrhoids seem to be getting worse please call the office.   Hot Tubs:  Hot tubs Jacuzzis and saunas are not recommended while pregnant.  These increase your internal body temperature and should be avoided.  Intercourse:  Sexual intercourse is safe during pregnancy as long as you are comfortable, unless otherwise advised by your provider.  Spotting may occur after intercourse; report any bright red bleeding that is heavier than spotting.  Labor:  If you know that you are in labor, please go to the hospital.  If you are unsure, please call the office and let us help you decide what to do.  Lifting, straining, etc:  If your job requires heavy lifting or straining please check with your provider for any limitations.  Generally, you should not lift items heavier than that you can lift simply with your hands and arms (no back  muscles)  Painting:  Paint fumes do not harm your pregnancy, but may make you ill and should be avoided if possible.  Latex or water based paints have less odor than oils.  Use adequate ventilation while painting.  Permanents & Hair Color:  Chemicals in hair dyes are not recommended as they cause increase hair dryness which can increase hair loss during pregnancy.  " Highlighting" and permanents are allowed.  Dye may be absorbed differently and permanents may not hold as well during pregnancy.  Sunbathing:  Use a sunscreen, as skin burns easily during pregnancy.  Drink plenty of fluids; avoid over heating.  Tanning Beds:  Because their possible side effects are still unknown, tanning beds are not recommended.  Ultrasound Scans:  Routine ultrasounds are performed at approximately 20 weeks.  You will be able to see your baby's general anatomy an if you would like to know the gender this can usually be determined as well.  If it is questionable when you conceived you may also receive an ultrasound early in your pregnancy for dating purposes.  Otherwise ultrasound exams are not routinely performed unless there is a medical necessity.  Although you can request a scan we ask that you pay for it when conducted because insurance does not cover " patient request" scans.  Work: If your pregnancy proceeds without complications you may work until your due   date, unless your physician or employer advises otherwise.  Round Ligament Pain/Pelvic Discomfort:  Sharp, shooting pains not associated with bleeding are fairly common, usually occurring in the second trimester of pregnancy.  They tend to be worse when standing up or when you remain standing for long periods of time.  These are the result of pressure of certain pelvic ligaments called "round ligaments".  Rest, Tylenol and heat seem to be the most effective relief.  As the womb and fetus grow, they rise out of the pelvis and the discomfort improves.  Please  notify the office if your pain seems different than that described.  It may represent a more serious condition.  Common Medications Safe in Pregnancy  Acne:      Constipation:  Benzoyl Peroxide     Colace  Clindamycin      Dulcolax Suppository  Topica Erythromycin     Fibercon  Salicylic Acid      Metamucil         Miralax AVOID:        Senakot   Accutane    Cough:  Retin-A       Cough Drops  Tetracycline      Phenergan w/ Codeine if Rx  Minocycline      Robitussin (Plain & DM)  Antibiotics:     Crabs/Lice:  Ceclor       RID  Cephalosporins    AVOID:  E-Mycins      Kwell  Keflex  Macrobid/Macrodantin   Diarrhea:  Penicillin      Kao-Pectate  Zithromax      Imodium AD         PUSH FLUIDS AVOID:       Cipro     Fever:  Tetracycline      Tylenol (Regular or Extra  Minocycline       Strength)  Levaquin      Extra Strength-Do not          Exceed 8 tabs/24 hrs Caffeine:        <200mg/day (equiv. To 1 cup of coffee or  approx. 3 12 oz sodas)         Gas: Cold/Hayfever:       Gas-X  Benadryl      Mylicon  Claritin       Phazyme  **Claritin-D        Chlor-Trimeton    Headaches:  Dimetapp      ASA-Free Excedrin  Drixoral-Non-Drowsy     Cold Compress  Mucinex (Guaifenasin)     Tylenol (Regular or Extra  Sudafed/Sudafed-12 Hour     Strength)  **Sudafed PE Pseudoephedrine   Tylenol Cold & Sinus     Vicks Vapor Rub  Zyrtec  **AVOID if Problems With Blood Pressure         Heartburn: Avoid lying down for at least 1 hour after meals  Aciphex      Maalox     Rash:  Milk of Magnesia     Benadryl    Mylanta       1% Hydrocortisone Cream  Pepcid  Pepcid Complete   Sleep Aids:  Prevacid      Ambien   Prilosec       Benadryl  Rolaids       Chamomile Tea  Tums (Limit 4/day)     Unisom         Tylenol PM         Warm milk-add vanilla or  Hemorrhoids:         Sugar for taste  Anusol/Anusol H.C.  (RX: Analapram 2.5%)  Sugar Substitutes:  Hydrocortisone OTC     Ok in  moderation  Preparation H      Tucks        Vaseline lotion applied to tissue with wiping    Herpes:     Throat:  Acyclovir      Oragel  Famvir  Valtrex     Vaccines:         Flu Shot Leg Cramps:       *Gardasil  Benadryl      Hepatitis A         Hepatitis B Nasal Spray:       Pneumovax  Saline Nasal Spray     Polio Booster         Tetanus Nausea:       Tuberculosis test or PPD  Vitamin B6 25 mg TID   AVOID:    Dramamine      *Gardasil  Emetrol       Live Poliovirus  Ginger Root 250 mg QID    MMR (measles, mumps &  High Complex Carbs @ Bedtime    rebella)  Sea Bands-Accupressure    Varicella (Chickenpox)  Unisom 1/2 tab TID     *No known complications           If received before Pain:         Known pregnancy;   Darvocet       Resume series after  Lortab        Delivery  Percocet    Yeast:   Tramadol      Femstat  Tylenol 3      Gyne-lotrimin  Ultram       Monistat  Vicodin           MISC:         All Sunscreens           Hair Coloring/highlights          Insect Repellant's          (Including DEET)         Mystic Tans

## 2020-07-30 LAB — URINALYSIS, ROUTINE W REFLEX MICROSCOPIC
Bilirubin, UA: NEGATIVE
Glucose, UA: NEGATIVE
Ketones, UA: NEGATIVE
Leukocytes,UA: NEGATIVE
Nitrite, UA: NEGATIVE
RBC, UA: NEGATIVE
Specific Gravity, UA: 1.02 (ref 1.005–1.030)
Urobilinogen, Ur: 1 mg/dL (ref 0.2–1.0)
pH, UA: 7.5 (ref 5.0–7.5)

## 2020-07-30 LAB — GC/CHLAMYDIA PROBE AMP
Chlamydia trachomatis, NAA: NEGATIVE
Neisseria Gonorrhoeae by PCR: NEGATIVE

## 2020-07-30 LAB — VIRAL HEPATITIS HBV, HCV
HCV Ab: <0.1 s/co ratio (ref 0.0–0.9)
Hep B Core Total Ab: NEGATIVE
Hep B Surface Ab, Qual: REACTIVE
Hepatitis B Surface Ag: NEGATIVE

## 2020-07-30 LAB — HCV INTERPRETATION

## 2020-07-30 LAB — HIV ANTIBODY (ROUTINE TESTING W REFLEX): HIV Screen 4th Generation wRfx: NONREACTIVE

## 2020-07-30 LAB — RUBELLA SCREEN: Rubella Antibodies, IGG: 3.31 index (ref >0.99)

## 2020-07-30 LAB — ANTIBODY SCREEN: Antibody Screen: NEGATIVE

## 2020-07-30 LAB — ABO AND RH: Rh Factor: NEGATIVE

## 2020-07-30 LAB — VARICELLA ZOSTER ANTIBODY, IGG: Varicella zoster IgG: 673 index (ref >165)

## 2020-07-30 LAB — RPR: RPR Ser Ql: NONREACTIVE

## 2020-07-30 NOTE — Telephone Encounter (Signed)
Pt seen by FH yesterday. Meds erxed.

## 2020-08-01 LAB — URINE CULTURE, OB REFLEX

## 2020-08-01 LAB — CULTURE, OB URINE

## 2020-08-02 ENCOUNTER — Other Ambulatory Visit: Payer: Self-pay | Admitting: Surgical

## 2020-08-02 MED ORDER — NITROFURANTOIN MONOHYD MACRO 100 MG PO CAPS: 100.0000 mg | ORAL_CAPSULE | Freq: Two times a day (BID) | ORAL | 0 refills | Status: DC

## 2020-08-14 ENCOUNTER — Other Ambulatory Visit: Payer: Self-pay | Admitting: Obstetrics and Gynecology

## 2020-08-14 ENCOUNTER — Other Ambulatory Visit: Payer: Self-pay

## 2020-08-14 ENCOUNTER — Ambulatory Visit
Admission: RE | Admit: 2020-08-14 | Discharge: 2020-08-14 | Disposition: A | Discharge status: Home / Self Care | Payer: Medicaid Other | Source: Ambulatory Visit | Attending: Obstetrics and Gynecology | Admitting: Obstetrics and Gynecology

## 2020-08-14 DIAGNOSIS — Z3200 Encounter for pregnancy test, result unknown: Secondary | ICD-10-CM | POA: Insufficient documentation

## 2020-08-14 DIAGNOSIS — N912 Amenorrhea, unspecified: Secondary | ICD-10-CM

## 2020-08-20 ENCOUNTER — Other Ambulatory Visit: Payer: Self-pay

## 2020-08-20 ENCOUNTER — Ambulatory Visit (INDEPENDENT_AMBULATORY_CARE_PROVIDER_SITE_OTHER): Payer: Medicaid Other | Admitting: Obstetrics and Gynecology

## 2020-08-20 ENCOUNTER — Encounter: Payer: Self-pay | Admitting: Obstetrics and Gynecology

## 2020-08-20 ENCOUNTER — Other Ambulatory Visit (HOSPITAL_COMMUNITY)
Admission: RE | Admit: 2020-08-20 | Discharge: 2020-08-20 | Disposition: A | Discharge status: Home / Self Care | Payer: Medicaid Other | Source: Ambulatory Visit | Attending: Obstetrics and Gynecology | Admitting: Obstetrics and Gynecology

## 2020-08-20 VITALS — BP 96/61 | HR 90 | Wt 125.2 lb

## 2020-08-20 DIAGNOSIS — O219 Vomiting of pregnancy, unspecified: Secondary | ICD-10-CM | POA: Insufficient documentation

## 2020-08-20 DIAGNOSIS — Z3A12 12 weeks gestation of pregnancy: Secondary | ICD-10-CM | POA: Insufficient documentation

## 2020-08-20 DIAGNOSIS — Z3481 Encounter for supervision of other normal pregnancy, first trimester: Secondary | ICD-10-CM

## 2020-08-20 LAB — NICOTINE SCREEN, URINE: Cotinine Ql Scrn, Ur: POSITIVE ng/mL — AB

## 2020-08-20 LAB — DRUG PROFILE, UR, 9 DRUGS (LABCORP)
Amphetamines, Urine: NEGATIVE ng/mL
Barbiturate Quant, Ur: NEGATIVE ng/mL
Benzodiazepine Quant, Ur: NEGATIVE ng/mL
Cannabinoid Quant, Ur: POSITIVE — AB
Cocaine (Metab.): NEGATIVE ng/mL
Methadone Screen, Urine: NEGATIVE ng/mL
Opiate Quant, Ur: NEGATIVE ng/mL
PCP Quant, Ur: NEGATIVE ng/mL
Propoxyphene: NEGATIVE ng/mL

## 2020-08-20 LAB — POCT URINALYSIS DIPSTICK OB
Bilirubin, UA: NEGATIVE
Blood, UA: NEGATIVE
Glucose, UA: NEGATIVE
Ketones, UA: NEGATIVE
Leukocytes, UA: NEGATIVE
Nitrite, UA: NEGATIVE
POC,PROTEIN,UA: NEGATIVE
Spec Grav, UA: 1.025 (ref 1.010–1.025)
Urobilinogen, UA: 0.2 E.U./dL
pH, UA: 6 (ref 5.0–8.0)

## 2020-08-20 MED ORDER — ONDANSETRON HCL 4 MG PO TABS: 4.0000 mg | ORAL_TABLET | Freq: Three times a day (TID) | ORAL | 2 refills | Status: DC | PRN

## 2020-08-20 MED ORDER — SERTRALINE HCL 50 MG PO TABS: 50.0000 mg | ORAL_TABLET | Freq: Every day | ORAL | 1 refills | Status: DC

## 2020-08-20 NOTE — Progress Notes (Signed)
NOB Physical: She has been really sick with nausea and vomiting. She ran out of Zofran about a week ago and needs a refill.

## 2020-08-20 NOTE — Addendum Note (Signed)
Addended by: Elonda Husky on: 08/20/2020 02:23 PM   Modules accepted: Orders

## 2020-08-20 NOTE — Progress Notes (Signed)
[name]: Very emotional-her sister was recently murdered and she is still emotionally processing this.  Remote history of depression using antidepressants.  Patient would like to start antidepressants.  Risk benefits discussed Zoloft ordered.  Patient continues to experience nausea and vomiting of pregnancy.  Zofran reordered.  MaterniT 21 today.  Dating changed based on ultrasound.  Pap performed.  Patient has discontinued tobacco use.  aFP next visit.  Physical examination General NAD, Conversant  HEENT Atraumatic; Op clear with mmm.  Normo-cephalic. Pupils reactive. Anicteric sclerae  Thyroid/Neck Smooth without nodularity or enlargement. Normal ROM.  Neck Supple.  Skin No rashes, lesions or ulceration. Normal palpated skin turgor. No nodularity.  Breasts: No masses or discharge.  Symmetric.  No axillary adenopathy.  Lungs: Clear to auscultation.No rales or wheezes. Normal Respiratory effort, no retractions.  Heart: NSR.  No murmurs or rubs appreciated. No periferal edema  Abdomen: Soft.  Non-tender.  No masses.  No HSM. No hernia  Extremities: Moves all appropriately.  Normal ROM for age. No lymphadenopathy.  Neuro: Oriented to PPT.  Normal mood. Normal affect.     Pelvic:   Vulva: Normal appearance.  No lesions.  Vagina: No lesions or abnormalities noted.  Support: Normal pelvic support.  Urethra No masses tenderness or scarring.  Meatus Normal size without lesions or prolapse.  Cervix: Normal appearance.  No lesions.  Anus: Normal exam.  No lesions.  Perineum: Normal exam.  No lesions.        Bimanual   Adnexae: No masses.  Non-tender to palpation.  Uterus: Enlarged. 13wks POS FHTs  Non-tender.  Mobile.  AV.  Adnexae: No masses.  Non-tender to palpation.  Cul-de-sac: Negative for abnormality.  Adnexae: No masses.  Non-tender to palpation.         Pelvimetry   Diagonal: Reached.  Spines: Average.  Sacrum: Concave.  Pubic Arch: Normal.

## 2020-08-23 ENCOUNTER — Telehealth: Payer: Self-pay | Admitting: Obstetrics and Gynecology

## 2020-08-23 LAB — CYTOLOGY - PAP
Comment: NEGATIVE
Diagnosis: NEGATIVE
High risk HPV: NEGATIVE

## 2020-08-23 NOTE — Telephone Encounter (Signed)
Patient states that she received all her medications but the Acyclovir.  She stated that the pharmacy told her that they never received it.  She would like for the rx sent to the [facility]

## 2020-08-23 NOTE — Telephone Encounter (Signed)
Please advise on Medication. You have never prescribed this.

## 2020-08-24 LAB — MATERNIT21  PLUS CORE+ESS+SCA, BLOOD

## 2020-08-26 ENCOUNTER — Encounter: Payer: Medicaid Other | Admitting: Certified Nurse Midwife

## 2020-08-27 NOTE — Telephone Encounter (Signed)
VM not set up. Will try later.

## 2020-08-28 NOTE — Telephone Encounter (Signed)
VM not set up.

## 2020-08-29 ENCOUNTER — Encounter: Payer: Medicaid Other | Admitting: Obstetrics and Gynecology

## 2020-08-30 NOTE — Telephone Encounter (Signed)
Called x 2. NO response. Chart closed.

## 2020-09-10 ENCOUNTER — Telehealth: Payer: Self-pay

## 2020-09-10 NOTE — Telephone Encounter (Signed)
Call transferred from front desk-   Pt states nurse said she had a rx for valtrex at the pharmacy. Advised pt that the provider gives acyclovir starting at 36 weeks. No rx has been sent in. Pt is not having any sx currently. She will discuss with the provider at [place].

## 2020-09-17 ENCOUNTER — Encounter: Payer: Medicaid Other | Admitting: Obstetrics and Gynecology

## 2020-09-20 ENCOUNTER — Encounter: Payer: Self-pay | Admitting: Obstetrics and Gynecology

## 2020-09-24 ENCOUNTER — Other Ambulatory Visit: Payer: Self-pay

## 2020-09-24 ENCOUNTER — Encounter: Payer: Self-pay | Admitting: Obstetrics and Gynecology

## 2020-09-24 ENCOUNTER — Ambulatory Visit (INDEPENDENT_AMBULATORY_CARE_PROVIDER_SITE_OTHER): Payer: Medicaid Other | Admitting: Obstetrics and Gynecology

## 2020-09-24 VITALS — BP 96/63 | HR 83 | Wt 129.5 lb

## 2020-09-24 DIAGNOSIS — Z3A19 19 weeks gestation of pregnancy: Secondary | ICD-10-CM

## 2020-09-24 DIAGNOSIS — O99342 Other mental disorders complicating pregnancy, second trimester: Secondary | ICD-10-CM

## 2020-09-24 DIAGNOSIS — F32A Depression, unspecified: Secondary | ICD-10-CM | POA: Diagnosis not present

## 2020-09-24 DIAGNOSIS — Z3402 Encounter for supervision of normal first pregnancy, second trimester: Secondary | ICD-10-CM | POA: Diagnosis not present

## 2020-09-24 LAB — POCT URINALYSIS DIPSTICK OB
Glucose, UA: NEGATIVE
Ketones, UA: NEGATIVE
POC,PROTEIN,UA: NEGATIVE
Spec Grav, UA: 1.015 (ref 1.010–1.025)
pH, UA: 6 (ref 5.0–8.0)

## 2020-09-24 NOTE — Progress Notes (Signed)
ROB: Notes nausea/vomiting have improved.  Depression improved with Zoloft. Has stopped cigarettes.  Using marijuana sporadically to help with nausea, but using much less as her symptoms have been improving. Plans to stop soon. Scheduled anatomy scan. RTC in 4 weeks.

## 2020-09-24 NOTE — Progress Notes (Signed)
ROB: She is doing well today. She reports feeling much better, nausea and vomiting have resolved. Zoloft is working well for her.

## 2020-09-24 NOTE — Patient Instructions (Signed)

## 2020-10-07 ENCOUNTER — Other Ambulatory Visit: Payer: Self-pay

## 2020-10-07 ENCOUNTER — Ambulatory Visit
Admission: RE | Admit: 2020-10-07 | Discharge: 2020-10-07 | Disposition: A | Discharge status: Home / Self Care | Payer: Medicaid Other | Source: Ambulatory Visit | Attending: Obstetrics and Gynecology | Admitting: Obstetrics and Gynecology

## 2020-10-07 DIAGNOSIS — Z3402 Encounter for supervision of normal first pregnancy, second trimester: Secondary | ICD-10-CM | POA: Diagnosis not present

## 2020-10-08 ENCOUNTER — Telehealth: Payer: Self-pay | Admitting: Obstetrics and Gynecology

## 2020-10-08 NOTE — Telephone Encounter (Signed)
Patient would like someone to call her and discuss her anatomy scan results.

## 2020-10-08 NOTE — Telephone Encounter (Signed)
Called patient to discuss results of genetic testing. No answer LVMTCB.

## 2020-10-23 ENCOUNTER — Encounter: Payer: Self-pay | Admitting: Obstetrics and Gynecology

## 2020-10-23 ENCOUNTER — Ambulatory Visit (INDEPENDENT_AMBULATORY_CARE_PROVIDER_SITE_OTHER): Payer: Medicaid Other | Admitting: Obstetrics and Gynecology

## 2020-10-23 ENCOUNTER — Other Ambulatory Visit: Payer: Self-pay

## 2020-10-23 VITALS — BP 129/74 | HR 92 | Wt 137.7 lb

## 2020-10-23 DIAGNOSIS — Z3A23 23 weeks gestation of pregnancy: Secondary | ICD-10-CM

## 2020-10-23 DIAGNOSIS — Z3403 Encounter for supervision of normal first pregnancy, third trimester: Secondary | ICD-10-CM

## 2020-10-23 LAB — POCT URINALYSIS DIPSTICK OB
Bilirubin, UA: NEGATIVE
Blood, UA: NEGATIVE
Glucose, UA: NEGATIVE
Ketones, UA: NEGATIVE
Leukocytes, UA: NEGATIVE
POC,PROTEIN,UA: NEGATIVE
Spec Grav, UA: 1.02 (ref 1.010–1.025)
pH, UA: 6 (ref 5.0–8.0)

## 2020-10-23 NOTE — Progress Notes (Signed)
ROB: Patient has no complaints.  Reports Zoloft is working well.  PHQ-9 = 0.  Reports daily fetal movement.  Anatomy scan ultrasound reviewed.  EIF discussed.  Ultrasound scheduled for 27 weeks as follow-up.  1 hour GCT next visit.

## 2020-10-23 NOTE — Progress Notes (Signed)
ROB: She is doing well today, she has no new concerns. 

## 2020-11-08 ENCOUNTER — Other Ambulatory Visit: Payer: Self-pay

## 2020-11-08 ENCOUNTER — Ambulatory Visit
Admission: RE | Admit: 2020-11-08 | Discharge: 2020-11-08 | Disposition: A | Discharge status: Home / Self Care | Payer: Medicaid Other | Source: Ambulatory Visit | Attending: Obstetrics and Gynecology | Admitting: Obstetrics and Gynecology

## 2020-11-08 DIAGNOSIS — Z3A23 23 weeks gestation of pregnancy: Secondary | ICD-10-CM | POA: Diagnosis not present

## 2020-11-08 DIAGNOSIS — Z3492 Encounter for supervision of normal pregnancy, unspecified, second trimester: Secondary | ICD-10-CM | POA: Insufficient documentation

## 2020-11-19 NOTE — Patient Instructions (Addendum)
Common Medications Safe in Pregnancy  Acne:      Constipation:  Benzoyl Peroxide     Colace  Clindamycin      Dulcolax Suppository  Topica Erythromycin     Fibercon  Salicylic Acid      Metamucil         Miralax AVOID:        Senakot   Accutane    Cough:  Retin-A       Cough Drops  Tetracycline      Phenergan w/ Codeine if Rx  Minocycline      Robitussin (Plain & DM)  Antibiotics:     Crabs/Lice:  Ceclor       RID  Cephalosporins    AVOID:  E-Mycins      Kwell  Keflex  Macrobid/Macrodantin   Diarrhea:  Penicillin      Kao-Pectate  Zithromax      Imodium AD         PUSH FLUIDS AVOID:       Cipro     Fever:  Tetracycline      Tylenol (Regular or Extra  Minocycline       Strength)  Levaquin      Extra Strength-Do not          Exceed 8 tabs/24 hrs Caffeine:        <200mg/day (equiv. To 1 cup of coffee or  approx. 3 12 oz sodas)         Gas: Cold/Hayfever:       Gas-X  Benadryl      Mylicon  Claritin       Phazyme  **Claritin-D        Chlor-Trimeton    Headaches:  Dimetapp      ASA-Free Excedrin  Drixoral-Non-Drowsy     Cold Compress  Mucinex (Guaifenasin)     Tylenol (Regular or Extra  Sudafed/Sudafed-12 Hour     Strength)  **Sudafed PE Pseudoephedrine   Tylenol Cold & Sinus     Vicks Vapor Rub  Zyrtec  **AVOID if Problems With Blood Pressure         Heartburn: Avoid lying down for at least 1 hour after meals  Aciphex      Maalox     Rash:  Milk of Magnesia     Benadryl    Mylanta       1% Hydrocortisone Cream  Pepcid  Pepcid Complete   Sleep Aids:  Prevacid      Ambien   Prilosec       Benadryl  Rolaids       Chamomile Tea  Tums (Limit 4/day)     Unisom         Tylenol PM         Warm milk-add vanilla or  Hemorrhoids:       Sugar for taste  Anusol/Anusol H.C.  (RX: Analapram 2.5%)  Sugar Substitutes:  Hydrocortisone OTC     Ok in moderation  Preparation H      Tucks        Vaseline lotion applied to tissue with  wiping    Herpes:     Throat:  Acyclovir      Oragel  Famvir  Valtrex     Vaccines:         Flu Shot Leg Cramps:       *Gardasil  Benadryl      Hepatitis A         Hepatitis B Nasal Spray:         Pneumovax  Saline Nasal Spray     Polio Booster         Tetanus Nausea:       Tuberculosis test or PPD  Vitamin B6 25 mg TID   AVOID:    Dramamine      *Gardasil  Emetrol       Live Poliovirus  Ginger Root 250 mg QID    MMR (measles, mumps &  High Complex Carbs @ Bedtime    rebella)  Sea Bands-Accupressure    Varicella (Chickenpox)  Unisom 1/2 tab TID     *No known complications           If received before Pain:         Known pregnancy;   Darvocet       Resume series after  Lortab        Delivery  Percocet    Yeast:   Tramadol      Femstat  Tylenol 3      Gyne-lotrimin  Ultram       Monistat  Vicodin           MISC:         All Sunscreens           Hair Coloring/highlights          Insect Repellant's          (Including DEET)         Mystic Tans Influenza (Flu) Vaccine (Inactivated or Recombinant): What You Need to Know 1. Why get vaccinated? Influenza vaccine can prevent influenza (flu). Flu is a contagious disease that spreads around the [place] every year, usually between October and May. Anyone can get the flu, but it is more dangerous for some people. Infants and young children, people 72 years and older, pregnant people, and people with certain health conditions or a weakened immune system are at greatest risk of flu complications. Pneumonia, bronchitis, sinus infections, and ear infections are examples of flu-related complications. If you have a medical condition, such as heart disease, cancer, or diabetes, flu can make it worse. Flu can cause fever and chills, sore throat, muscle aches, fatigue, cough, headache, and runny or stuffy nose. Some people may have vomiting and diarrhea, though this is more common in children than adults. In an average year, thousands of  people in the [place] die from flu, and many more are hospitalized. Flu vaccine prevents millions of illnesses and flu-related visits to the doctor each year. 2. Influenza vaccines CDC recommends everyone 6 months and older get vaccinated every flu season. Children 6 months through 63 years of age may need 2 doses during a single flu season. Everyone else needs only 1 dose each flu season. It takes about 2 weeks for protection to develop after vaccination. There are many flu viruses, and they are always changing. Each year a new flu vaccine is made to protect against the influenza viruses believed to be likely to cause disease in the upcoming flu season. Even when the vaccine doesn't exactly match these viruses, it may still provide some protection. Influenza vaccine does not cause flu. Influenza vaccine may be given at the same time as other vaccines. 3. Talk with your health care provider Tell your vaccination provider if the person getting the vaccine: Has had an allergic reaction after a previous dose of influenza vaccine, or has any severe, life-threatening allergies Has ever had Guillain-Barr Syndrome (also called "GBS") In some cases, your health care provider may  decide to postpone influenza vaccination until a future visit. Influenza vaccine can be administered at any time during pregnancy. People who are or will be pregnant during influenza season should receive inactivated influenza vaccine. People with minor illnesses, such as a cold, may be vaccinated. People who are moderately or severely ill should usually wait until they recover before getting influenza vaccine. Your health care provider can give you more information. 4. Risks of a vaccine reaction Soreness, redness, and swelling where the shot is given, fever, muscle aches, and headache can happen after influenza vaccination. There may be a very small increased risk of Guillain-Barr Syndrome (GBS) after inactivated influenza  vaccine (the flu shot). Young children who get the flu shot along with pneumococcal vaccine (PCV13) and/or DTaP vaccine at the same time might be slightly more likely to have a seizure caused by fever. Tell your health care provider if a child who is getting flu vaccine has ever had a seizure. People sometimes faint after medical procedures, including vaccination. Tell your provider if you feel dizzy or have vision changes or ringing in the ears. As with any medicine, there is a very remote chance of a vaccine causing a severe allergic reaction, other serious injury, or death. 5. What if there is a serious problem? An allergic reaction could occur after the vaccinated person leaves the clinic. If you see signs of a severe allergic reaction (hives, swelling of the face and throat, difficulty breathing, a fast heartbeat, dizziness, or weakness), call 9-1-1 and get the person to the nearest hospital. For other signs that concern you, call your health care provider. Adverse reactions should be reported to the Vaccine Adverse Event Reporting System (VAERS). Your health care provider will usually file this report, or you can do it yourself. Visit the VAERS website at www.vaers.SamedayNews.es or call 224-801-1541. VAERS is only for reporting reactions, and VAERS staff members do not give medical advice. 6. The National Vaccine Injury Compensation Program The [employer] Vaccine Injury Compensation Program (VICP) is a federal program that was created to compensate people who may have been injured by certain vaccines. Claims regarding alleged injury or death due to vaccination have a time limit for filing, which may be as short as two years. Visit the VICP website at GoldCloset.com.ee or call 760-337-6386 to learn about the program and about filing a claim. 7. How can I learn more? Ask your health care provider. Call your local or state health department. Visit the website of the Food and Drug  Administration (FDA) for vaccine package inserts and additional information at TraderRating.uy. Contact the Centers for Disease Control and Prevention (CDC): Call 520-190-6865 (1-800-CDC-INFO) or Visit CDC's website at https://gibson.com/. Vaccine Information Statement Inactivated Influenza Vaccine (10/13/2019) This information is not intended to replace advice given to you by your health care provider. Make sure you discuss any questions you have with your health care provider. Document Revised: 11/30/2019 Document Reviewed: 11/30/2019 Elsevier Patient Education  [place]. Tdap (Tetanus, Diphtheria, Pertussis) Vaccine: What You Need to Know 1. Why get vaccinated? Tdap vaccine can prevent tetanus, diphtheria, and pertussis. Diphtheria and pertussis spread from person to person. Tetanus enters the body through cuts or wounds. TETANUS (T) causes painful stiffening of the muscles. Tetanus can lead to serious health problems, including being unable to open the mouth, having trouble swallowing and breathing, or death. DIPHTHERIA (D) can lead to difficulty breathing, heart failure, paralysis, or death. PERTUSSIS (aP), also known as "whooping cough," can cause uncontrollable, violent coughing that  makes it hard to breathe, eat, or drink. Pertussis can be extremely serious especially in babies and young children, causing pneumonia, convulsions, brain damage, or death. In teens and adults, it can cause weight loss, loss of bladder control, passing out, and rib fractures from severe coughing. 2. Tdap vaccine Tdap is only for children 7 years and older, adolescents, and adults.  Adolescents should receive a single dose of Tdap, preferably at age 5 or 65 years. Pregnant people should get a dose of Tdap during every pregnancy, preferably during the early part of the third trimester, to help protect the newborn from pertussis. Infants are most at risk for severe,  life-threatening complications from pertussis. Adults who have never received Tdap should get a dose of Tdap. Also, adults should receive a booster dose of either Tdap or Td (a different vaccine that protects against tetanus and diphtheria but not pertussis) every 10 years, or after 5 years in the case of a severe or dirty wound or burn. Tdap may be given at the same time as other vaccines. 3. Talk with your health care provider Tell your vaccine provider if the person getting the vaccine: Has had an allergic reaction after a previous dose of any vaccine that protects against tetanus, diphtheria, or pertussis, or has any severe, life-threatening allergies Has had a coma, decreased level of consciousness, or prolonged seizures within 7 days after a previous dose of any pertussis vaccine (DTP, DTaP, or Tdap) Has seizures or another nervous system problem Has ever had Guillain-Barr Syndrome (also called "GBS") Has had severe pain or swelling after a previous dose of any vaccine that protects against tetanus or diphtheria In some cases, your health care provider may decide to postpone Tdap vaccination until a future visit. People with minor illnesses, such as a cold, may be vaccinated. People who are moderately or severely ill should usually wait until they recover before getting Tdap vaccine.  Your health care provider can give you more information. 4. Risks of a vaccine reaction Pain, redness, or swelling where the shot was given, mild fever, headache, feeling tired, and nausea, vomiting, diarrhea, or stomachache sometimes happen after Tdap vaccination. People sometimes faint after medical procedures, including vaccination. Tell your provider if you feel dizzy or have vision changes or ringing in the ears.  As with any medicine, there is a very remote chance of a vaccine causing a severe allergic reaction, other serious injury, or death. 5. What if there is a serious problem? An allergic reaction  could occur after the vaccinated person leaves the clinic. If you see signs of a severe allergic reaction (hives, swelling of the face and throat, difficulty breathing, a fast heartbeat, dizziness, or weakness), call 9-1-1 and get the person to the nearest hospital. For other signs that concern you, call your health care provider.  Adverse reactions should be reported to the Vaccine Adverse Event Reporting System (VAERS). Your health care provider will usually file this report, or you can do it yourself. Visit the VAERS website at www.vaers.SamedayNews.es or call 781-705-7332. VAERS is only for reporting reactions, and VAERS staff members do not give medical advice. 6. The National Vaccine Injury Compensation Program The [employer] Vaccine Injury Compensation Program (VICP) is a federal program that was created to compensate people who may have been injured by certain vaccines. Claims regarding alleged injury or death due to vaccination have a time limit for filing, which may be as short as two years. Visit the VICP website at GoldCloset.com.ee or call  1-706-866-5432 to learn about the program and about filing a claim. 7. How can I learn more? Ask your health care provider. Call your local or state health department. Visit the website of the Food and Drug Administration (FDA) for vaccine package inserts and additional information at TraderRating.uy. Contact the Centers for Disease Control and Prevention (CDC): Call 862-419-4846 (1-800-CDC-INFO) or Visit CDC's website at http://hunter.com/. Vaccine Information Statement Tdap (Tetanus, Diphtheria, Pertussis) Vaccine (10/13/2019) This information is not intended to replace advice given to you by your health care provider. Make sure you discuss any questions you have with your health care provider. Document Revised: 11/08/2019 Document Reviewed: 11/08/2019 Elsevier Patient Education  2022 [employer].

## 2020-11-20 ENCOUNTER — Ambulatory Visit (INDEPENDENT_AMBULATORY_CARE_PROVIDER_SITE_OTHER): Payer: Medicaid Other | Admitting: Obstetrics and Gynecology

## 2020-11-20 ENCOUNTER — Other Ambulatory Visit: Payer: Self-pay

## 2020-11-20 ENCOUNTER — Encounter: Payer: Self-pay | Admitting: Obstetrics and Gynecology

## 2020-11-20 VITALS — BP 119/69 | HR 90 | Wt 142.1 lb

## 2020-11-20 DIAGNOSIS — F1291 Cannabis use, unspecified, in remission: Secondary | ICD-10-CM

## 2020-11-20 DIAGNOSIS — Z3482 Encounter for supervision of other normal pregnancy, second trimester: Secondary | ICD-10-CM

## 2020-11-20 DIAGNOSIS — Z87898 Personal history of other specified conditions: Secondary | ICD-10-CM

## 2020-11-20 DIAGNOSIS — Z3A27 27 weeks gestation of pregnancy: Secondary | ICD-10-CM

## 2020-11-20 DIAGNOSIS — O26899 Other specified pregnancy related conditions, unspecified trimester: Secondary | ICD-10-CM

## 2020-11-20 DIAGNOSIS — Z23 Encounter for immunization: Secondary | ICD-10-CM | POA: Diagnosis not present

## 2020-11-20 DIAGNOSIS — Z6791 Unspecified blood type, Rh negative: Secondary | ICD-10-CM

## 2020-11-20 DIAGNOSIS — O26893 Other specified pregnancy related conditions, third trimester: Secondary | ICD-10-CM | POA: Insufficient documentation

## 2020-11-20 LAB — POCT URINALYSIS DIPSTICK OB
Bilirubin, UA: NEGATIVE
Blood, UA: NEGATIVE
Glucose, UA: NEGATIVE
Ketones, UA: NEGATIVE
Leukocytes, UA: NEGATIVE
Nitrite, UA: NEGATIVE
POC,PROTEIN,UA: NEGATIVE
Spec Grav, UA: 1.025 (ref 1.010–1.025)
Urobilinogen, UA: 0.2 E.U./dL
pH, UA: 6 (ref 5.0–8.0)

## 2020-11-20 MED ORDER — TETANUS-DIPHTH-ACELL PERTUSSIS 5-2.5-18.5 LF-MCG/0.5 IM SUSY
0.5000 mL | PREFILLED_SYRINGE | Freq: Once | INTRAMUSCULAR | Status: AC
  Administered 2020-11-20: 0.5 mL via INTRAMUSCULAR

## 2020-11-20 MED ORDER — RHO D IMMUNE GLOBULIN 1500 UNIT/2ML IJ SOSY
300.0000 ug | PREFILLED_SYRINGE | Freq: Once | INTRAMUSCULAR | Status: AC
  Administered 2020-11-20: 300 ug via INTRAMUSCULAR

## 2020-11-20 NOTE — Progress Notes (Signed)
OB-Pt present for routine prenatal care and 28 week labs. Pt stated that she would like to discuss morning sickness and MJ use. BTC/tdap/flu/ Rhogam completed.

## 2020-11-20 NOTE — Progress Notes (Signed)
For 28 week labs today.  Plans to breastfeed (see breastfeeding topics), desires  Vasectomy  for contraception (however will go ahead and sign Medicaid tubal papers as well). For Tdap/flu vaccine today, signed blood consent.  Rhogam administered. Repeat UDS for h/o MJ use. EIF stable on 25 week scan.  No further f/u needed. Advised that it will likely resolve by birth. RTC in 3 weeks.

## 2020-11-21 LAB — CBC
Hematocrit: 28.7 % — ABNORMAL LOW (ref 34.0–46.6)
Hemoglobin: 10 g/dL — ABNORMAL LOW (ref 11.1–15.9)
MCH: 32.4 pg (ref 26.6–33.0)
MCHC: 34.8 g/dL (ref 31.5–35.7)
MCV: 93 fL (ref 79–97)
Platelets: 214 10*3/uL (ref 150–450)
RBC: 3.09 x10E6/uL — ABNORMAL LOW (ref 3.77–5.28)
RDW: 12.4 % (ref 11.7–15.4)
WBC: 9.2 10*3/uL (ref 3.4–10.8)

## 2020-11-21 LAB — RPR: RPR Ser Ql: NONREACTIVE

## 2020-11-21 LAB — GLUCOSE, 1 HOUR GESTATIONAL: Gestational Diabetes Screen: 143 mg/dL — ABNORMAL HIGH (ref 65–139)

## 2020-11-25 LAB — DRUG PROFILE, UR, 9 DRUGS (LABCORP)
Amphetamines, Urine: NEGATIVE ng/mL
Barbiturate Quant, Ur: NEGATIVE ng/mL
Benzodiazepine Quant, Ur: NEGATIVE ng/mL
Cannabinoid Quant, Ur: POSITIVE — AB
Cocaine (Metab.): NEGATIVE ng/mL
Methadone Screen, Urine: NEGATIVE ng/mL
Opiate Quant, Ur: NEGATIVE ng/mL
PCP Quant, Ur: NEGATIVE ng/mL
Propoxyphene: NEGATIVE ng/mL

## 2020-11-26 ENCOUNTER — Other Ambulatory Visit: Payer: Self-pay

## 2020-11-26 DIAGNOSIS — Z3483 Encounter for supervision of other normal pregnancy, third trimester: Secondary | ICD-10-CM

## 2020-11-26 DIAGNOSIS — Z3A28 28 weeks gestation of pregnancy: Secondary | ICD-10-CM

## 2020-11-27 ENCOUNTER — Other Ambulatory Visit: Payer: Self-pay

## 2020-11-27 ENCOUNTER — Other Ambulatory Visit: Payer: Medicaid Other

## 2020-11-28 LAB — GESTATIONAL GLUCOSE TOLERANCE
Glucose, Fasting: 84 mg/dL (ref 65–94)
Glucose, GTT - 1 Hour: 168 mg/dL (ref 65–179)
Glucose, GTT - 2 Hour: 112 mg/dL (ref 65–154)
Glucose, GTT - 3 Hour: 83 mg/dL (ref 65–139)

## 2020-12-11 ENCOUNTER — Encounter: Payer: Medicaid Other | Admitting: Obstetrics and Gynecology

## 2020-12-12 ENCOUNTER — Telehealth: Payer: Self-pay | Admitting: Obstetrics and Gynecology

## 2020-12-12 NOTE — Telephone Encounter (Signed)
Patient called asking for a doctors excuse for her work, pt lifts heavy boxes at work and believes she has over done it, has been out for the past 2 days with cramping and pain, denies spotting. Pt asked that we leave message if called she has poor cell service at home and does check her mychart often. Please Advise.

## 2020-12-13 NOTE — Telephone Encounter (Signed)
Phone number is no longer in service. Sent pt a mychart message earlier today requesting what days she had token off and to get more information about her call to the office.

## 2020-12-13 NOTE — Telephone Encounter (Signed)
Patient called no answer LM via VM to please contact the office on Monday morning to speak more about her concerns and what type of work note is needed.

## 2020-12-18 ENCOUNTER — Encounter: Payer: Self-pay | Admitting: Obstetrics and Gynecology

## 2020-12-18 ENCOUNTER — Ambulatory Visit (INDEPENDENT_AMBULATORY_CARE_PROVIDER_SITE_OTHER): Payer: Medicaid Other | Admitting: Obstetrics and Gynecology

## 2020-12-18 ENCOUNTER — Other Ambulatory Visit: Payer: Self-pay

## 2020-12-18 VITALS — BP 135/84 | HR 103 | Wt 150.5 lb

## 2020-12-18 DIAGNOSIS — Z3483 Encounter for supervision of other normal pregnancy, third trimester: Secondary | ICD-10-CM

## 2020-12-18 LAB — POCT URINALYSIS DIPSTICK OB
Bilirubin, UA: NEGATIVE
Blood, UA: NEGATIVE
Glucose, UA: NEGATIVE
Ketones, UA: NEGATIVE
Leukocytes, UA: NEGATIVE
Nitrite, UA: NEGATIVE
Odor: NEGATIVE
POC,PROTEIN,UA: NEGATIVE
Spec Grav, UA: >=1.03 — AB (ref 1.010–1.025)
Urobilinogen, UA: 0.2 E.U./dL
pH, UA: 6 (ref 5.0–8.0)

## 2020-12-18 NOTE — Progress Notes (Signed)
ROB- swelling in feet after work. Would like to decrease hours.

## 2020-12-18 NOTE — Progress Notes (Signed)
ROB: Reports baby very active.  Has no complaints.  Note given for work no heavy lifting or prolonged standing.

## 2020-12-25 ENCOUNTER — Encounter: Payer: Medicaid Other | Admitting: Obstetrics and Gynecology

## 2020-12-26 ENCOUNTER — Telehealth: Payer: Self-pay | Admitting: Obstetrics and Gynecology

## 2020-12-26 NOTE — Telephone Encounter (Signed)
Pt called she had a tooth break off, her gums are swollen and she is in extreme pain, her dentist will not see her until she has a consent to treat from OB since she is currently pregnant. Please Advise  Pt stated that we could send via mychart.

## 2020-12-30 NOTE — Telephone Encounter (Signed)
Ok. Letter has been sent as advised.

## 2021-01-01 ENCOUNTER — Encounter: Payer: Self-pay | Admitting: Obstetrics and Gynecology

## 2021-01-01 ENCOUNTER — Other Ambulatory Visit: Payer: Self-pay

## 2021-01-01 ENCOUNTER — Ambulatory Visit (INDEPENDENT_AMBULATORY_CARE_PROVIDER_SITE_OTHER): Payer: Medicaid Other | Admitting: Obstetrics and Gynecology

## 2021-01-01 VITALS — BP 111/64 | HR 98 | Wt 154.0 lb

## 2021-01-01 DIAGNOSIS — Z3483 Encounter for supervision of other normal pregnancy, third trimester: Secondary | ICD-10-CM

## 2021-01-01 DIAGNOSIS — Z3A33 33 weeks gestation of pregnancy: Secondary | ICD-10-CM

## 2021-01-01 LAB — POCT URINALYSIS DIPSTICK OB
Bilirubin, UA: NEGATIVE
Blood, UA: NEGATIVE
Glucose, UA: NEGATIVE
Ketones, UA: NEGATIVE
Leukocytes, UA: NEGATIVE
Nitrite, UA: NEGATIVE
Spec Grav, UA: 1.025
Urobilinogen, UA: 0.2 U/dL
pH, UA: 6

## 2021-01-01 NOTE — Patient Instructions (Signed)
Third Trimester of Pregnancy The third trimester of pregnancy is from week 28 through week 68. This is also called months 7 through 9. This trimester is when your unborn baby (fetus) is growing very fast. At the end of the ninth month, the unborn baby is about 20 inches long. It weighs about 6-10 pounds. Body changes during your third trimester Your body continues to go through many changes during this time. The changes vary and generally return to normal after the baby is born. Physical changes Your weight will continue to increase. You may gain 25-35 pounds (11-16 kg) by the end of the pregnancy. If you are underweight, you may gain 28-40 lb (about 13-18 kg). If you are overweight, you may gain 15-25 lb (about 7-11 kg). You may start to get stretch marks on your hips, belly (abdomen), and breasts. Your breasts will continue to grow and may hurt. A yellow fluid (colostrum) may leak from your breasts. This is the first milk you are making for your baby. You may have changes in your hair. Your belly button may stick out. You may have more swelling in your hands, face, or ankles. Health changes You may have heartburn. You may have trouble pooping (constipation). You may get hemorrhoids. These are swollen veins in the butt that can itch or get painful. You may have swollen veins (varicose veins) in your legs. You may have more body aches in the pelvis, back, or thighs. You may have more tingling or numbness in your hands, arms, and legs. The skin on your belly may also feel numb. You may feel short of breath as your womb (uterus) gets bigger. Other changes You may pee (urinate) more often. You may have more problems sleeping. You may notice the unborn baby "dropping," or moving lower in your belly. You may have more discharge coming from your vagina. Your joints may feel loose, and you may have pain around your pelvic bone. Follow these instructions at home: Medicines Take over-the-counter  and prescription medicines only as told by your doctor. Some medicines are not safe during pregnancy. Take a prenatal vitamin that contains at least 600 micrograms (mcg) of folic acid. Eating and drinking Eat healthy meals that include: Fresh fruits and vegetables. Whole grains. Good sources of protein, such as meat, eggs, or tofu. Low-fat dairy products. Avoid raw meat and unpasteurized juice, milk, and cheese. These carry germs that can harm you and your baby. Eat 4 or 5 small meals rather than 3 large meals a day. You may need to take these actions to prevent or treat trouble pooping: Drink enough fluids to keep your pee (urine) pale yellow. Eat foods that are high in fiber. These include beans, whole grains, and fresh fruits and vegetables. Limit foods that are high in fat and sugar. These include fried or sweet foods. Activity Exercise only as told by your doctor. Stop exercising if you start to have cramps in your womb. Avoid heavy lifting. Do not exercise if it is too hot or too humid, or if you are in a place of great height (high altitude). If you choose to, you may have sex unless your doctor tells you not to. Relieving pain and discomfort Take breaks often, and rest with your legs raised (elevated) if you have leg cramps or low back pain. Take warm water baths (sitz baths) to soothe pain or discomfort caused by hemorrhoids. Use hemorrhoid cream if your doctor approves. Wear a good support bra if your breasts are tender.  If you develop bulging, swollen veins in your legs: Wear support hose as told by your doctor. Raise your feet for 15 minutes, 3-4 times a day. Limit salt in your food. Safety Talk to your doctor before traveling far distances. Do not use hot tubs, steam rooms, or saunas. Wear your seat belt at all times when you are in a car. Talk with your doctor if someone is hurting you or yelling at you a lot. Preparing for your baby's arrival To prepare for the arrival  of your baby: Take prenatal classes. Visit the hospital and tour the maternity area. Buy a rear-facing car seat. Learn how to install it in your car. Prepare the baby's room. Take out all pillows and stuffed animals from the baby's crib. General instructions Avoid cat litter boxes and soil used by cats. These carry germs that can cause harm to the baby and can cause a loss of your baby by miscarriage or stillbirth. Do not douche or use tampons. Do not use scented sanitary pads. Do not smoke or use any products that contain nicotine or tobacco. If you need help quitting, ask your doctor. Do not drink alcohol. Do not use herbal medicines, illegal drugs, or medicines that were not approved by your doctor. Chemicals in these products can affect your baby. Keep all follow-up visits. This is important. Where to find more information American Pregnancy Association: americanpregnancy.org [employer]: www.acog.org Office on Women's Health: KeywordPortfolios.com.br Contact a doctor if: You have a fever. You have mild cramps or pressure in your lower belly. You have a nagging pain in your belly area. You vomit, or you have watery poop (diarrhea). You have bad-smelling fluid coming from your vagina. You have pain when you pee, or your pee smells bad. You have a headache that does not go away when you take medicine. You have changes in how you see, or you see spots in front of your eyes. Get help right away if: Your water breaks. You have regular contractions that are less than 5 minutes apart. You are spotting or bleeding from your vagina. You have very bad belly cramps or pain. You have trouble breathing. You have chest pain. You faint. You have not felt the baby move for the amount of time told by your doctor. You have new or increased pain, swelling, or redness in an arm or leg. Summary The third trimester is from week 28 through week 40 (months 7  through 9). This is the time when your unborn baby is growing very fast. During this time, your discomfort may increase as you gain weight and as your baby grows. Get ready for your baby to arrive by taking prenatal classes, buying a rear-facing car seat, and preparing the baby's room. Get help right away if you are bleeding from your vagina, you have chest pain and trouble breathing, or you have not felt the baby move for the amount of time told by your doctor. This information is not intended to replace advice given to you by your health care provider. Make sure you discuss any questions you have with your health care provider. Document Revised: 08/02/2019 Document Reviewed: 06/08/2019 Elsevier Patient Education  [employer].   Common Medications Safe in Pregnancy  Acne:      Constipation:  Benzoyl Peroxide     Colace  Clindamycin      Dulcolax Suppository  Topica Erythromycin     Fibercon  Salicylic Acid  Metamucil         Miralax AVOID:        Senakot   Accutane    Cough:  Retin-A       Cough Drops  Tetracycline      Phenergan w/ Codeine if Rx  Minocycline      Robitussin (Plain & DM)  Antibiotics:     Crabs/Lice:  Ceclor       RID  Cephalosporins    AVOID:  E-Mycins      Kwell  Keflex  Macrobid/Macrodantin   Diarrhea:  Penicillin      Kao-Pectate  Zithromax      Imodium AD         PUSH FLUIDS AVOID:       Cipro     Fever:  Tetracycline      Tylenol (Regular or Extra  Minocycline       Strength)  Levaquin      Extra Strength-Do not          Exceed 8 tabs/24 hrs Caffeine:        <251m/day (equiv. To 1 cup of coffee or  approx. 3 12 oz sodas)         Gas: Cold/Hayfever:       Gas-X  Benadryl      Mylicon  Claritin       Phazyme  **Claritin-D        Chlor-Trimeton    Headaches:  Dimetapp      ASA-Free Excedrin  Drixoral-Non-Drowsy     Cold Compress  Mucinex (Guaifenasin)     Tylenol (Regular or Extra  Sudafed/Sudafed-12 Hour     Strength)  **Sudafed PE  Pseudoephedrine   Tylenol Cold & Sinus     Vicks Vapor Rub  Zyrtec  **AVOID if Problems With Blood Pressure         Heartburn: Avoid lying down for at least 1 hour after meals  Aciphex      Maalox     Rash:  Milk of Magnesia     Benadryl    Mylanta       1% Hydrocortisone Cream  Pepcid  Pepcid Complete   Sleep Aids:  Prevacid      Ambien   Prilosec       Benadryl  Rolaids       Chamomile Tea  Tums (Limit 4/day)     Unisom         Tylenol PM         Warm milk-add vanilla or  Hemorrhoids:       Sugar for taste  Anusol/Anusol H.C.  (RX: Analapram 2.5%)  Sugar Substitutes:  Hydrocortisone OTC     Ok in moderation  Preparation H      Tucks        Vaseline lotion applied to tissue with wiping    Herpes:     Throat:  Acyclovir      Oragel  Famvir  Valtrex     Vaccines:         Flu Shot Leg Cramps:       *Gardasil  Benadryl      Hepatitis A         Hepatitis B Nasal Spray:       Pneumovax  Saline Nasal Spray     Polio Booster         Tetanus Nausea:       Tuberculosis test or PPD  Vitamin B6 25 mg TID   AVOID:  Dramamine      *Gardasil  Emetrol       Live Poliovirus  Ginger Root 250 mg QID    MMR (measles, mumps &  High Complex Carbs @ Bedtime    rebella)  Sea Bands-Accupressure    Varicella (Chickenpox)  Unisom 1/2 tab TID     *No known complications           If received before Pain:         Known pregnancy;   Darvocet       Resume series after  Lortab        Delivery  Percocet    Yeast:   Tramadol      Femstat  Tylenol 3      Gyne-lotrimin  Ultram       Monistat  Vicodin           MISC:         All Sunscreens           Hair Coloring/highlights          Insect Repellant's          (Including DEET)         Mystic Tans

## 2021-01-01 NOTE — Progress Notes (Signed)
ROB: Reports feeling tired but otherwise no complaints. Reviewed breastfeeding. RTC in 2 weeks.

## 2021-01-01 NOTE — Progress Notes (Signed)
ROB: She feels tired, but has no concerns.

## 2021-01-16 ENCOUNTER — Other Ambulatory Visit: Payer: Self-pay

## 2021-01-16 ENCOUNTER — Ambulatory Visit (INDEPENDENT_AMBULATORY_CARE_PROVIDER_SITE_OTHER): Payer: Medicaid Other | Admitting: Obstetrics and Gynecology

## 2021-01-16 VITALS — BP 103/66 | HR 91 | Wt 160.4 lb

## 2021-01-16 DIAGNOSIS — Z3483 Encounter for supervision of other normal pregnancy, third trimester: Secondary | ICD-10-CM

## 2021-01-16 DIAGNOSIS — Z3A35 35 weeks gestation of pregnancy: Secondary | ICD-10-CM

## 2021-01-16 LAB — POCT URINALYSIS DIPSTICK OB
Bilirubin, UA: NEGATIVE
Blood, UA: NEGATIVE
Glucose, UA: NEGATIVE
Ketones, UA: NEGATIVE
Leukocytes, UA: NEGATIVE
Nitrite, UA: NEGATIVE
POC,PROTEIN,UA: NEGATIVE
Spec Grav, UA: 1.015 (ref 1.010–1.025)
Urobilinogen, UA: 0.2 E.U./dL
pH, UA: 7.5 (ref 5.0–8.0)

## 2021-01-16 NOTE — Progress Notes (Signed)
ROB: Doing well.  No complaints.  Reports daily movement.  Taking vitamins as directed.  Cultures next visit.

## 2021-01-23 ENCOUNTER — Encounter: Payer: Self-pay | Admitting: Obstetrics and Gynecology

## 2021-01-23 ENCOUNTER — Ambulatory Visit (INDEPENDENT_AMBULATORY_CARE_PROVIDER_SITE_OTHER): Payer: Medicaid Other | Admitting: Obstetrics and Gynecology

## 2021-01-23 ENCOUNTER — Other Ambulatory Visit: Payer: Self-pay

## 2021-01-23 VITALS — BP 119/74 | HR 116 | Wt 160.4 lb

## 2021-01-23 DIAGNOSIS — Z8619 Personal history of other infectious and parasitic diseases: Secondary | ICD-10-CM

## 2021-01-23 DIAGNOSIS — Z3483 Encounter for supervision of other normal pregnancy, third trimester: Secondary | ICD-10-CM

## 2021-01-23 DIAGNOSIS — Z3A36 36 weeks gestation of pregnancy: Secondary | ICD-10-CM

## 2021-01-23 LAB — POCT URINALYSIS DIPSTICK OB
Bilirubin, UA: NEGATIVE
Blood, UA: NEGATIVE
Glucose, UA: NEGATIVE
Ketones, UA: NEGATIVE
Leukocytes, UA: NEGATIVE
Nitrite, UA: NEGATIVE
POC,PROTEIN,UA: NEGATIVE
Spec Grav, UA: 1.015 (ref 1.010–1.025)
Urobilinogen, UA: 0.2 E.U./dL
pH, UA: 6 (ref 5.0–8.0)

## 2021-01-23 LAB — OB RESULTS CONSOLE GC/CHLAMYDIA: Gonorrhea: NEGATIVE

## 2021-01-23 NOTE — Progress Notes (Signed)
ROB: Feeling back pain/contractions for the past few days, intermittent but moderately painful. Asks about scheduling IOL as she does not have any local family support and her husband works out of town every other weekend. Would prefer date of Dec 5th. Discussed that this could be arranged due to circumstances.  36 week cultures performed today.  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 RTC in 1 week.

## 2021-01-23 NOTE — Progress Notes (Signed)
Ob-Pt present for routine prenatal care and 36 week cultures. Pt stated that she was

## 2021-01-23 NOTE — Patient Instructions (Signed)

## 2021-01-24 MED ORDER — ACYCLOVIR 400 MG PO TABS: 400.0000 mg | ORAL_TABLET | Freq: Three times a day (TID) | ORAL | 1 refills | Status: DC

## 2021-01-25 LAB — STREP GP B NAA+RFLX: Strep Gp B NAA+Rflx: NEGATIVE

## 2021-01-27 LAB — GC/CHLAMYDIA PROBE AMP
Chlamydia trachomatis, NAA: NEGATIVE
Neisseria Gonorrhoeae by PCR: NEGATIVE

## 2021-01-28 ENCOUNTER — Other Ambulatory Visit: Payer: Self-pay

## 2021-01-28 ENCOUNTER — Encounter: Payer: Self-pay | Admitting: Obstetrics and Gynecology

## 2021-01-28 ENCOUNTER — Ambulatory Visit (INDEPENDENT_AMBULATORY_CARE_PROVIDER_SITE_OTHER): Payer: Medicaid Other | Admitting: Obstetrics and Gynecology

## 2021-01-28 VITALS — BP 115/70 | HR 88 | Wt 161.3 lb

## 2021-01-28 DIAGNOSIS — Z3A37 37 weeks gestation of pregnancy: Secondary | ICD-10-CM

## 2021-01-28 DIAGNOSIS — Z3483 Encounter for supervision of other normal pregnancy, third trimester: Secondary | ICD-10-CM

## 2021-01-28 LAB — POCT URINALYSIS DIPSTICK OB
Bilirubin, UA: NEGATIVE
Blood, UA: NEGATIVE
Glucose, UA: NEGATIVE
Ketones, UA: NEGATIVE
Leukocytes, UA: NEGATIVE
Nitrite, UA: NEGATIVE
POC,PROTEIN,UA: NEGATIVE
Spec Grav, UA: 1.025 (ref 1.010–1.025)
Urobilinogen, UA: 0.2 E.U./dL
pH, UA: 6 (ref 5.0–8.0)

## 2021-01-28 NOTE — Progress Notes (Signed)
ROB: Reports "mild" contractions.  Still interested in induction of labor as described below.

## 2021-01-29 ENCOUNTER — Encounter: Payer: Medicaid Other | Admitting: Obstetrics and Gynecology

## 2021-02-04 ENCOUNTER — Other Ambulatory Visit: Payer: Self-pay

## 2021-02-04 ENCOUNTER — Ambulatory Visit (INDEPENDENT_AMBULATORY_CARE_PROVIDER_SITE_OTHER): Payer: Medicaid Other | Admitting: Obstetrics and Gynecology

## 2021-02-04 VITALS — BP 120/74 | HR 96 | Wt 161.1 lb

## 2021-02-04 DIAGNOSIS — Z3483 Encounter for supervision of other normal pregnancy, third trimester: Secondary | ICD-10-CM

## 2021-02-04 DIAGNOSIS — Z3A38 38 weeks gestation of pregnancy: Secondary | ICD-10-CM

## 2021-02-04 LAB — POCT URINALYSIS DIPSTICK OB
Bilirubin, UA: NEGATIVE
Blood, UA: NEGATIVE
Glucose, UA: NEGATIVE
Ketones, UA: NEGATIVE
Leukocytes, UA: NEGATIVE
Nitrite, UA: NEGATIVE
POC,PROTEIN,UA: NEGATIVE
Spec Grav, UA: 1.025 (ref 1.010–1.025)
Urobilinogen, UA: 0.2 E.U./dL
pH, UA: 6 (ref 5.0–8.0)

## 2021-02-04 NOTE — Progress Notes (Signed)
ROB: Doing well, no concerns.  Does feel occasional mild contractions. RTC in 1 week. IOL scheduled for Dec 5th (elective for childcare issues) at midnight Discussed labor precautions, need for COVID testing.

## 2021-02-04 NOTE — Patient Instructions (Addendum)
COVID 19 Instructions for Scheduled Procedure (Inductions/C-sections and GYN surgeries)   Thank you for choosing [facility] for your services.  You have been scheduled for a procedure called _______Induction of Labor__________.    Your procedure is scheduled on _________Monday, February 10, 2021 at midnight ____________.  You are required to have COVID-19 testing performed 2 days prior to your scheduled procedure date.  Testing is performed between 8 AM and 12 PM Monday through Friday.  Please present for testing on __Friday, December 2, 2022_________ during this hour. Testing is performed in front of the [facility] in the Pre-Admission Testing area on the second floor. Please call [phone] to schedule your COVID testing.     Upon your scheduled procedure date, you will need to arrive at the [facility] entrance. (There is a statue at the front of this entrance.)   Please arrive on time if you are scheduled for an induction of labor.   If you are scheduled for a Cesarean delivery or for Gyn Surgery, arrive 2 hours prior to your procedure time.  If you are an Obstetric patient and your arrival time falls between 11 PM and 6 AM call L&D [phone]) when you arrive.  A staff member will meet you at the [facility] entrance.  At this time, patients are allowed 2 support persons to accompany them. Face masks are required for you and your support person(s). Your support person(s) are now allowed to be there with you during the entire time of your admission.   Please contact the office if you have any questions regarding this information.  The [facility] office number is [phone].     Thank you,    Your [facility] Providers     Labor Induction Labor induction is when steps are taken to cause a pregnant woman to begin the labor process. Most women go into labor on their own between 37 weeks and 42 weeks of pregnancy. When this does not happen, or when there is a  medical need for labor to begin, steps may be taken to induce, or bring on, labor. Labor induction causes a pregnant woman's uterus to contract. It also causes the cervix to soften (ripen), open (dilate), and thin out. Usually, labor is not induced before 39 weeks of pregnancy unless there is a medical reason to do so. When is labor induction considered? Labor induction may be right for you if: Your pregnancy lasts longer than 41 to 42 weeks. Your placenta is separating from your uterus (placental abruption). You have a rupture of membranes and your labor does not begin. You have health problems, like diabetes or high blood pressure (preeclampsia) during your pregnancy. Your baby has stopped growing or does not have enough amniotic fluid. Before labor induction begins, your health care provider will consider the following factors: Your medical condition and the baby's condition. How many weeks you have been pregnant. How mature the baby's lungs are. The condition of your cervix. The position of the baby. The size of your birth canal. Tell a health care provider about: Any allergies you have. All medicines you are taking, including vitamins, herbs, eye drops, creams, and over-the-counter medicines. Any problems you or your family members have had with anesthetic medicines. Any surgeries you have had. Any blood disorders you have. Any medical conditions you have. What are the risks? Generally, this is a safe procedure. However, problems may occur, including: Failed induction. Changes in fetal heart rate, such as being  too high, too low, or irregular (erratic). Infection in the mother or the baby. Increased risk of having a cesarean delivery. Breaking off (abruption) of the placenta from the uterus. This is rare. Rupture of the uterus. This is very rare. Your baby could fail to get enough blood flow or oxygen. This can be life-threatening. When induction is needed for medical reasons,  the benefits generally outweigh the risks. What happens during the procedure? During the procedure, your health care provider will use one of these methods to induce labor: Stripping the membranes. In this method, the amniotic sac tissue is gently separated from the cervix. This causes the following to happen: Your cervix stretches, which in turn causes the release of prostaglandins. Prostaglandins induce labor and cause the uterus to contract. This procedure is often done in an office visit. You will be sent home to wait for contractions to begin. Prostaglandin medicine. This medicine starts contractions and causes the cervix to dilate and ripen. This can be taken by mouth (orally) or by being inserted into the vagina (suppository). Inserting a small, thin tube (catheter) with a balloon into the vagina and then expanding the balloon with water to dilate the cervix. Breaking the water. In this method, a small instrument is used to make a small hole in the amniotic sac. This eventually causes the amniotic sac to break. Contractions should begin within a few hours. Medicine to trigger or strengthen contractions. This medicine is given through an IV that is inserted into a vein in your arm. This procedure may vary among health care providers and hospitals. Where to find more information March of Dimes: www.marchofdimes.org [employer] of [employer]stetricians and Gynecologists: www.acog.org Summary Labor induction causes a pregnant woman's uterus to contract. It also causes the cervix to soften (ripen), open (dilate), and thin out. Labor is usually not induced before 39 weeks of pregnancy unless there is a medical reason to do so. When induction is needed for medical reasons, the benefits generally outweigh the risks. Talk with your health care provider about which methods of labor induction are right for you. This information is not intended to replace advice given to you by your health care  provider. Make sure you discuss any questions you have with your health care provider. Document Revised: 12/07/2019 Document Reviewed: 12/07/2019 Elsevier Patient Education  2022 [employer].

## 2021-02-04 NOTE — Progress Notes (Signed)
ROB: She is doing well today, no new concerns. 

## 2021-02-07 ENCOUNTER — Other Ambulatory Visit
Admission: RE | Admit: 2021-02-07 | Discharge: 2021-02-07 | Disposition: A | Discharge status: Home / Self Care | Payer: Medicaid Other | Source: Ambulatory Visit | Attending: Obstetrics and Gynecology | Admitting: Obstetrics and Gynecology

## 2021-02-07 ENCOUNTER — Other Ambulatory Visit: Payer: Self-pay

## 2021-02-07 DIAGNOSIS — Z20822 Contact with and (suspected) exposure to covid-19: Secondary | ICD-10-CM | POA: Insufficient documentation

## 2021-02-07 DIAGNOSIS — Z01812 Encounter for preprocedural laboratory examination: Secondary | ICD-10-CM | POA: Insufficient documentation

## 2021-02-07 LAB — SARS CORONAVIRUS 2 (TAT 6-24 HRS): SARS Coronavirus 2: NEGATIVE

## 2021-02-10 ENCOUNTER — Inpatient Hospital Stay
Admission: EM | Admit: 2021-02-10 | Discharge: 2021-02-11 | DRG: 807 | Disposition: A | Discharge status: Home / Self Care | Payer: Medicaid Other | Attending: Obstetrics and Gynecology | Admitting: Obstetrics and Gynecology

## 2021-02-10 ENCOUNTER — Inpatient Hospital Stay: Payer: Medicaid Other | Admitting: Anesthesiology

## 2021-02-10 ENCOUNTER — Encounter: Payer: Self-pay | Admitting: Obstetrics and Gynecology

## 2021-02-10 ENCOUNTER — Other Ambulatory Visit: Payer: Self-pay

## 2021-02-10 DIAGNOSIS — Z6791 Unspecified blood type, Rh negative: Secondary | ICD-10-CM

## 2021-02-10 DIAGNOSIS — A6000 Herpesviral infection of urogenital system, unspecified: Secondary | ICD-10-CM | POA: Diagnosis present

## 2021-02-10 DIAGNOSIS — O360110 Maternal care for anti-D [Rh] antibodies, first trimester, not applicable or unspecified: Secondary | ICD-10-CM | POA: Diagnosis not present

## 2021-02-10 DIAGNOSIS — O9832 Other infections with a predominantly sexual mode of transmission complicating childbirth: Secondary | ICD-10-CM | POA: Diagnosis present

## 2021-02-10 DIAGNOSIS — O26893 Other specified pregnancy related conditions, third trimester: Principal | ICD-10-CM | POA: Diagnosis present

## 2021-02-10 DIAGNOSIS — Z3A39 39 weeks gestation of pregnancy: Secondary | ICD-10-CM | POA: Diagnosis not present

## 2021-02-10 DIAGNOSIS — Z3490 Encounter for supervision of normal pregnancy, unspecified, unspecified trimester: Secondary | ICD-10-CM | POA: Diagnosis present

## 2021-02-10 HISTORY — DX: Other specified health status: Z78.9

## 2021-02-10 HISTORY — DX: Depression, unspecified: F32.A

## 2021-02-10 LAB — URINE DRUG SCREEN, QUALITATIVE (ARMC ONLY)
Amphetamines, Ur Screen: NOT DETECTED
Barbiturates, Ur Screen: NOT DETECTED
Benzodiazepine, Ur Scrn: NOT DETECTED
Cannabinoid 50 Ng, Ur ~~LOC~~: POSITIVE — AB
Cocaine Metabolite,Ur ~~LOC~~: NOT DETECTED
MDMA (Ecstasy)Ur Screen: NOT DETECTED
Methadone Scn, Ur: NOT DETECTED
Opiate, Ur Screen: NOT DETECTED
Phencyclidine (PCP) Ur S: NOT DETECTED
Tricyclic, Ur Screen: NOT DETECTED

## 2021-02-10 LAB — CBC
HCT: 31.7 % — ABNORMAL LOW (ref 36.0–46.0)
Hemoglobin: 10.9 g/dL — ABNORMAL LOW (ref 12.0–15.0)
MCH: 32.3 pg (ref 26.0–34.0)
MCHC: 34.4 g/dL (ref 30.0–36.0)
MCV: 94.1 fL (ref 80.0–100.0)
Platelets: 264 10*3/uL (ref 150–400)
RBC: 3.37 MIL/uL — ABNORMAL LOW (ref 3.87–5.11)
RDW: 13.3 % (ref 11.5–15.5)
WBC: 13.1 10*3/uL — ABNORMAL HIGH (ref 4.0–10.5)
nRBC: 0 % (ref 0.0–0.2)

## 2021-02-10 MED ORDER — ZOLPIDEM TARTRATE 5 MG PO TABS: 5.0000 mg | ORAL_TABLET | Freq: Every evening | ORAL | Status: DC | PRN

## 2021-02-10 MED ORDER — ONDANSETRON HCL 4 MG/2ML IJ SOLN
4.0000 mg | Freq: Four times a day (QID) | INTRAMUSCULAR | Status: DC | PRN
  Administered 2021-02-10: 4 mg via INTRAVENOUS
  Filled 2021-02-10: qty 2

## 2021-02-10 MED ORDER — AMMONIA AROMATIC IN INHA
RESPIRATORY_TRACT | Status: AC
  Filled 2021-02-10: qty 10

## 2021-02-10 MED ORDER — OXYTOCIN-SODIUM CHLORIDE 30-0.9 UT/500ML-% IV SOLN
2.5000 [IU]/h | INTRAVENOUS | Status: DC
  Filled 2021-02-10: qty 500

## 2021-02-10 MED ORDER — DIPHENHYDRAMINE HCL 50 MG/ML IJ SOLN: 12.5000 mg | INTRAMUSCULAR | Status: DC | PRN

## 2021-02-10 MED ORDER — DIPHENHYDRAMINE HCL 25 MG PO CAPS: 25.0000 mg | ORAL_CAPSULE | Freq: Four times a day (QID) | ORAL | Status: DC | PRN

## 2021-02-10 MED ORDER — ACETAMINOPHEN 325 MG PO TABS
650.0000 mg | ORAL_TABLET | ORAL | Status: DC | PRN
  Filled 2021-02-10: qty 2

## 2021-02-10 MED ORDER — PHENYLEPHRINE 40 MCG/ML (10ML) SYRINGE FOR IV PUSH (FOR BLOOD PRESSURE SUPPORT)
80.0000 ug | PREFILLED_SYRINGE | INTRAVENOUS | Status: DC | PRN
  Filled 2021-02-10: qty 10

## 2021-02-10 MED ORDER — FENTANYL-BUPIVACAINE-NACL 0.5-0.125-0.9 MG/250ML-% EP SOLN
EPIDURAL | Status: DC | PRN
  Administered 2021-02-10: 12 mL/h via EPIDURAL

## 2021-02-10 MED ORDER — LACTATED RINGERS IV SOLN: 500.0000 mL | Freq: Once | INTRAVENOUS | Status: DC

## 2021-02-10 MED ORDER — SERTRALINE HCL 25 MG PO TABS
50.0000 mg | ORAL_TABLET | Freq: Every day | ORAL | Status: DC
  Administered 2021-02-10: 50 mg via ORAL
  Filled 2021-02-10: qty 1
  Filled 2021-02-10: qty 2

## 2021-02-10 MED ORDER — BUTORPHANOL TARTRATE 1 MG/ML IJ SOLN
1.0000 mg | INTRAMUSCULAR | Status: DC | PRN
  Administered 2021-02-10: 1 mg via INTRAVENOUS
  Filled 2021-02-10: qty 1

## 2021-02-10 MED ORDER — OXYTOCIN 10 UNIT/ML IJ SOLN
INTRAMUSCULAR | Status: AC
  Filled 2021-02-10: qty 2

## 2021-02-10 MED ORDER — LIDOCAINE HCL (PF) 1 % IJ SOLN
30.0000 mL | INTRAMUSCULAR | Status: DC | PRN
  Filled 2021-02-10: qty 30

## 2021-02-10 MED ORDER — OXYCODONE-ACETAMINOPHEN 5-325 MG PO TABS: 2.0000 | ORAL_TABLET | ORAL | Status: DC | PRN

## 2021-02-10 MED ORDER — ACETAMINOPHEN 325 MG PO TABS
650.0000 mg | ORAL_TABLET | ORAL | Status: DC | PRN
  Administered 2021-02-10: 650 mg via ORAL

## 2021-02-10 MED ORDER — LIDOCAINE-EPINEPHRINE (PF) 1.5 %-1:200000 IJ SOLN
INTRAMUSCULAR | Status: DC | PRN
  Administered 2021-02-10: 3 mL via PERINEURAL

## 2021-02-10 MED ORDER — OXYTOCIN-SODIUM CHLORIDE 30-0.9 UT/500ML-% IV SOLN: 2.5000 [IU]/h | INTRAVENOUS | Status: DC | PRN

## 2021-02-10 MED ORDER — IBUPROFEN 600 MG PO TABS
600.0000 mg | ORAL_TABLET | Freq: Four times a day (QID) | ORAL | Status: DC
  Administered 2021-02-10 – 2021-02-11 (×4): 600 mg via ORAL
  Filled 2021-02-10 (×4): qty 1

## 2021-02-10 MED ORDER — DOCUSATE SODIUM 100 MG PO CAPS
100.0000 mg | ORAL_CAPSULE | Freq: Two times a day (BID) | ORAL | Status: DC
  Administered 2021-02-10 – 2021-02-11 (×2): 100 mg via ORAL
  Filled 2021-02-10 (×2): qty 1

## 2021-02-10 MED ORDER — LACTATED RINGERS IV SOLN: INTRAVENOUS | Status: DC

## 2021-02-10 MED ORDER — TERBUTALINE SULFATE 1 MG/ML IJ SOLN: 0.2500 mg | Freq: Once | INTRAMUSCULAR | Status: DC | PRN

## 2021-02-10 MED ORDER — LIDOCAINE HCL (PF) 1 % IJ SOLN
INTRAMUSCULAR | Status: AC
  Filled 2021-02-10: qty 30

## 2021-02-10 MED ORDER — EPHEDRINE 5 MG/ML INJ
10.0000 mg | INTRAVENOUS | Status: DC | PRN
  Filled 2021-02-10: qty 2

## 2021-02-10 MED ORDER — FENTANYL-BUPIVACAINE-NACL 0.5-0.125-0.9 MG/250ML-% EP SOLN: 12.0000 mL/h | EPIDURAL | Status: DC | PRN

## 2021-02-10 MED ORDER — MISOPROSTOL 25 MCG QUARTER TABLET
50.0000 ug | ORAL_TABLET | ORAL | Status: DC | PRN
  Administered 2021-02-10: 50 ug via VAGINAL
  Filled 2021-02-10: qty 1
  Filled 2021-02-10: qty 2
  Filled 2021-02-10: qty 1

## 2021-02-10 MED ORDER — SOD CITRATE-CITRIC ACID 500-334 MG/5ML PO SOLN: 30.0000 mL | ORAL | Status: DC | PRN

## 2021-02-10 MED ORDER — OXYTOCIN BOLUS FROM INFUSION
333.0000 mL | Freq: Once | INTRAVENOUS | Status: AC
  Administered 2021-02-10: 333 mL via INTRAVENOUS

## 2021-02-10 MED ORDER — SIMETHICONE 80 MG PO CHEW: 80.0000 mg | CHEWABLE_TABLET | ORAL | Status: DC | PRN

## 2021-02-10 MED ORDER — BUPIVACAINE HCL (PF) 0.25 % IJ SOLN
INTRAMUSCULAR | Status: DC | PRN
  Administered 2021-02-10 (×2): 4 mL via EPIDURAL

## 2021-02-10 MED ORDER — BENZOCAINE-MENTHOL 20-0.5 % EX AERO
1.0000 "application " | INHALATION_SPRAY | CUTANEOUS | Status: DC | PRN
  Administered 2021-02-10: 1 via TOPICAL
  Filled 2021-02-10: qty 56

## 2021-02-10 MED ORDER — LIDOCAINE HCL (PF) 1 % IJ SOLN
INTRAMUSCULAR | Status: DC | PRN
  Administered 2021-02-10: 3 mL

## 2021-02-10 MED ORDER — PRENATAL MULTIVITAMIN CH
1.0000 | ORAL_TABLET | Freq: Every day | ORAL | Status: DC
  Administered 2021-02-11: 1 via ORAL
  Filled 2021-02-10: qty 1

## 2021-02-10 MED ORDER — OXYCODONE-ACETAMINOPHEN 5-325 MG PO TABS
1.0000 | ORAL_TABLET | ORAL | Status: DC | PRN
  Administered 2021-02-10 – 2021-02-11 (×4): 1 via ORAL
  Filled 2021-02-10 (×4): qty 1

## 2021-02-10 MED ORDER — MISOPROSTOL 200 MCG PO TABS
ORAL_TABLET | ORAL | Status: AC
  Administered 2021-02-10: 50 ug via VAGINAL
  Filled 2021-02-10: qty 4

## 2021-02-10 MED ORDER — LACTATED RINGERS IV SOLN: 500.0000 mL | INTRAVENOUS | Status: DC | PRN

## 2021-02-10 MED ORDER — TETANUS-DIPHTH-ACELL PERTUSSIS 5-2.5-18.5 LF-MCG/0.5 IM SUSY
0.5000 mL | PREFILLED_SYRINGE | Freq: Once | INTRAMUSCULAR | Status: DC
  Filled 2021-02-10: qty 0.5

## 2021-02-10 MED ORDER — FENTANYL-BUPIVACAINE-NACL 0.5-0.125-0.9 MG/250ML-% EP SOLN
EPIDURAL | Status: AC
  Filled 2021-02-10: qty 250

## 2021-02-10 NOTE — Progress Notes (Signed)
Pt is here for scheduled IOL.

## 2021-02-10 NOTE — Progress Notes (Addendum)
Provider aware of pt arrival to unit. Pt is comfortable currently and denies LOF, VB, and states occasional ctx but nothing regular. EFM shows Cat 1 tracing with baseline 135, no decels and 15x15 accels.Pt has mostly UI with a rare ctx noted that palpates mild. SVE 1.5/50/-3. Cytotec placed at 0857.

## 2021-02-10 NOTE — H&P (Signed)
History and Physical   HPI  [name] is a 30 y.o. G3P2002 at [redacted]w[redacted]d Estimated Date of Delivery: 02/17/21 who is being admitted for induction of labor.   OB History  OB History  Gravida Para Term Preterm AB Living  3 2 2  0 0 2  SAB IAB Ectopic Multiple Live Births  0 0 0 0 2    # Outcome Date GA Lbr Len/2nd Weight Sex Delivery Anes PTL Lv  3 Current           2 Term 12/05/14   3402 g  Vag-Spont   LIV     Complications: Placenta Previa  1 Term 09/10/06   3317 g  Vag-Spont   LIV    PROBLEM LIST  Pregnancy complications or risks: Patient Active Problem List   Diagnosis Date Noted   Encounter for elective induction of labor 02/10/2021   Rh negative state in antepartum period 11/20/2020    Prenatal labs and studies: ABO, Rh: --/--/O NEG (12/05 0829) Antibody: POS (12/05 0829) Rubella: 3.31 (05/23 0954) RPR: Non Reactive (09/14 1151)  HBsAg: Negative (05/23 0954)  HIV: Non Reactive (05/23 0954)  GBS:--/Negative (11/17 1653)   Past Medical History:  Diagnosis Date   Depression    Herpes    HSV (herpes simplex virus) infection    Medical history non-contributory    Placenta previa      Past Surgical History:  Procedure Laterality Date   FACIAL RECONSTRUCTION SURGERY  2014     Medications    Current Discharge Medication List     CONTINUE these medications which have NOT CHANGED   Details  acyclovir (ZOVIRAX) 400 MG tablet Take 1 tablet (400 mg total) by mouth 3 (three) times daily. Qty: 90 tablet, Refills: 1    Prenatal Vit-Fe Phos-FA-Omega (VITAFOL GUMMIES) 3.33-0.333-34.8 MG CHEW Chew 3 tablets by mouth daily. Qty: 90 tablet, Refills: 11    acyclovir (ZOVIRAX) 200 MG capsule Take 400 mg by mouth 2 (two) times daily.         Allergies  Hydrocodone and Penicillins  Review of Systems  Pertinent items noted in HPI and remainder of comprehensive ROS otherwise negative.  Physical Exam  BP 131/68   Pulse 97   Temp 98.1 F (36.7  C) (Axillary)   Resp 16   Ht 5\' 5"  (1.651 m)   Wt 73 kg   LMP 05/23/2020   BMI 26.79 kg/m   Lungs:  CTA B Cardio: RRR without M/R/G Abd: Soft, gravid, NT Presentation: cephalic EXT: No C/C/ 1+ Edema DTRs: 2+ B CERVIX: Dilation: 1.5 Effacement (%): 50 Cervical Position: Posterior Station: -3 Presentation: Vertex Exam by:: 05/25/2020 RN  AROM - clear  See Prenatal records for more detailed PE.     FHR:  Variability: Good {> 6 bpm)  Toco: Uterine Contractions: Q2-4 min  Test Results  Results for orders placed or performed during the hospital encounter of 02/10/21 (from the past 24 hour(s))  CBC     Status: Abnormal   Collection Time: 02/10/21  8:29 AM  Result Value Ref Range   WBC 13.1 (H) 4.0 - 10.5 K/uL   RBC 3.37 (L) 3.87 - 5.11 MIL/uL   Hemoglobin 10.9 (L) 12.0 - 15.0 g/dL   HCT 14/05/22 (L) 14/05/22 - 58.8 %   MCV 94.1 80.0 - 100.0 fL   MCH 32.3 26.0 - 34.0 pg   MCHC 34.4 30.0 - 36.0 g/dL   RDW 32.5 49.8 - 26.4 %  Platelets 264 150 - 400 K/uL   nRBC 0.0 0.0 - 0.2 %  Type and screen     Status: None   Collection Time: 02/10/21  8:29 AM  Result Value Ref Range   ABO/RH(D) O NEG    Antibody Screen POS    Sample Expiration 02/13/2021,2359    Antibody Identification      PASSIVELY ACQUIRED ANTI-D Performed at [facility], [address] [place], [place] [address]   Urine Drug Screen, Qualitative [facility] only)     Status: Abnormal   Collection Time: 02/10/21 10:18 AM  Result Value Ref Range   Tricyclic, Ur Screen NONE DETECTED NONE DETECTED   Amphetamines, Ur Screen NONE DETECTED NONE DETECTED   MDMA (Ecstasy)Ur Screen NONE DETECTED NONE DETECTED   Cocaine Metabolite,Ur [place] NONE DETECTED NONE DETECTED   Opiate, Ur Screen NONE DETECTED NONE DETECTED   Phencyclidine (PCP) Ur S NONE DETECTED NONE DETECTED   Cannabinoid 50 Ng, Ur [place] POSITIVE (A) NONE DETECTED   Barbiturates, Ur Screen NONE DETECTED NONE DETECTED   Benzodiazepine, Ur Scrn NONE DETECTED NONE  DETECTED   Methadone Scn, Ur NONE DETECTED NONE DETECTED     Assessment   G3P2002 at [redacted]w[redacted]d Estimated Date of Delivery: 02/17/21  The fetus is reassuring.   Patient Active Problem List   Diagnosis Date Noted   Encounter for elective induction of labor 02/10/2021   Rh negative state in antepartum period 11/20/2020    Plan  1. Admit to L&D :   2. EFM: -- Category 1 3. Stadol or Epidural if desired.   4. Admission labs  5. Misoprostol given 6. Expect SVD  Elonda Husky, M.D. 02/10/2021 1:11 PM

## 2021-02-10 NOTE — Anesthesia Preprocedure Evaluation (Signed)
Anesthesia Evaluation  Patient identified by MRN, date of birth, ID band Patient awake    Reviewed: Allergy & Precautions, H&P , NPO status , Patient's Chart, lab work & pertinent test results  Airway Mallampati: II       Dental no notable dental hx. (+) Teeth Intact   Pulmonary neg pulmonary ROS,           Cardiovascular negative cardio ROS       Neuro/Psych Depression negative neurological ROS     GI/Hepatic Neg liver ROS, GERD  ,  Endo/Other  negative endocrine ROS  Renal/GU negative Renal ROS  negative genitourinary   Musculoskeletal   Abdominal   Peds  Hematology negative hematology ROS (+)   Anesthesia Other Findings   Reproductive/Obstetrics (+) Pregnancy                             Anesthesia Physical Anesthesia Plan  ASA: 2  Anesthesia Plan: Epidural   Post-op Pain Management:    Induction:   PONV Risk Score and Plan:   Airway Management Planned:   Additional Equipment:   Intra-op Plan:   Post-operative Plan:   Informed Consent: I have reviewed the patients History and Physical, chart, labs and discussed the procedure including the risks, benefits and alternatives for the proposed anesthesia with the patient or authorized representative who has indicated his/her understanding and acceptance.       Plan Discussed with: Anesthesiologist and CRNA  Anesthesia Plan Comments:         Anesthesia Quick Evaluation

## 2021-02-10 NOTE — Lactation Note (Signed)
This note was copied from a baby's chart. Lactation Consultation Note  Patient Name: [name] [id number] Date: 02/10/2021 Reason for consult: Initial assessment;1st time breastfeeding;Term Age:32 hours  Initial lactation visit. Mom is P3, SVD 2 hours ago. This is mom's third baby, but her first time breastfeeding. Baby was active at the breast upon entry- mom stated it took a couple of tries to sustain latch, baby appeared deep with flanged upper/bottom lips and had a rhythmic sucking pattern.  Baby fed for about 7 mins with LC at bedside. BF education provided: newborn stomach size, feeding patterns and behaviors, cluster feeding overnight, and output expectations. Encouraged 8-12 feeding attempts in first 24 hours but provided reassurance if baby didn't seem interested each time. Taught and demonstrated hand expression and how this can be used to encourage a feeding and also for mom to help build supply if there are times baby is not interested. Encouraged skin to skin as much as possible, feeding on demand with early cues.  Maternal Data Has patient been taught Hand Expression?: Yes Does the patient have breastfeeding experience prior to this delivery?: No (did not BF her first two children)  Feeding Mother's Current Feeding Choice: Breast Milk and Formula  LATCH Score Latch: Repeated attempts needed to sustain latch, nipple held in mouth throughout feeding, stimulation needed to elicit sucking reflex.  Audible Swallowing: A few with stimulation  Type of Nipple: Everted at rest and after stimulation  Comfort (Breast/Nipple): Soft / non-tender  Hold (Positioning): No assistance needed to correctly position infant at breast.  LATCH Score: 8   Lactation Tools Discussed/Used    Interventions Interventions: Breast feeding basics reviewed;Hand express;Support pillows;Education  Discharge    Consult Status Consult Status: Follow-up Date: 02/11/21 Follow-up type:  In-patient    Danford Bad 02/10/2021, 5:27 PM

## 2021-02-10 NOTE — Progress Notes (Signed)
RN called provider to inform him of baseline change to 155 with late decelerations and variables noted. RN at bedside doing position changes and fluid bolus.

## 2021-02-10 NOTE — Anesthesia Procedure Notes (Signed)
Epidural Patient location during procedure: OB Start time: 02/10/2021 1:42 PM End time: 02/10/2021 1:53 PM  Staffing Anesthesiologist: Reed Breech, MD Resident/CRNA: Elmarie Mainland, CRNA Performed: resident/CRNA   Preanesthetic Checklist Completed: patient identified, IV checked, site marked, risks and benefits discussed, surgical consent, monitors and equipment checked, pre-op evaluation and timeout performed  Epidural Patient position: sitting Prep: ChloraPrep Patient monitoring: heart rate, continuous pulse ox and blood pressure Approach: midline Location: L3-L4 Injection technique: LOR saline  Needle:  Needle type: Tuohy  Needle gauge: 17 G Needle length: 9 cm and 9 Needle insertion depth: 5 cm Catheter type: closed end flexible Catheter size: 19 Gauge Catheter at skin depth: 9 cm Test dose: negative and 1.5% lidocaine with Epi 1:200 K  Assessment Sensory level: T10 Events: blood not aspirated, injection not painful, no injection resistance, no paresthesia and negative IV test  Additional Notes 1 attempt Pt. Evaluated and documentation done after procedure finished. Patient identified. Risks/Benefits/Options discussed with patient including but not limited to bleeding, infection, nerve damage, paralysis, failed block, incomplete pain control, headache, blood pressure changes, nausea, vomiting, reactions to medication both or allergic, itching and postpartum back pain. Confirmed with bedside nurse the patient's most recent platelet count. Confirmed with patient that they are not currently taking any anticoagulation, have any bleeding history or any family history of bleeding disorders. Patient expressed understanding and wished to proceed. All questions were answered. Sterile technique was used throughout the entire procedure. Please see nursing notes for vital signs. Test dose was given through epidural catheter and negative prior to continuing to dose epidural or start  infusion. Warning signs of high block given to the patient including shortness of breath, tingling/numbness in hands, complete motor block, or any concerning symptoms with instructions to call for help. Patient was given instructions on fall risk and not to get out of bed. All questions and concerns addressed with instructions to call with any issues or inadequate analgesia.    Patient tolerated the insertion well without immediate complications.Reason for block:procedure for pain

## 2021-02-11 LAB — RPR: RPR Ser Ql: NONREACTIVE

## 2021-02-11 MED ORDER — IBUPROFEN 600 MG PO TABS
600.0000 mg | ORAL_TABLET | Freq: Four times a day (QID) | ORAL | 0 refills | Status: DC
Start: 2021-02-11 — End: 2021-07-30

## 2021-02-11 NOTE — TOC Initial Note (Signed)
Transition of Care [facility]) - Initial/Assessment Note    Patient Details  Name: [name] MRN: [id number] Date of Birth: 11-26-1990  [facility]) CM/SW Contact:    [place] Cellar, RN Phone Number: 02/11/2021, 12:56 PM  Clinical Narrative:                 Spoke with patient regarding (+) THC in infant and mandated CPS reporting. MOB verbalizes understanding and reports she tried to not utilize [facility] during pregnancy however suffered from severe nausea/vomiting and THC allowed her to eat. Reports she was honest with OB and updated throughout pregnancy about the need and reported she had never had any before this pregnancy. No issues in other pregnancies. Patient has all needed equipment and is engaged with [facility] services. Has appointment for later in the week with [facility] services and plans to obtain electric breastpump. Reports she will take the manual breastpump from hospital to cover her until [facility] appointment. Has selected pediatrician and has appointment after discharge. No current needs or concerns. No history of PPD but understands signs/symptoms and resources if needed.           Patient Goals and CMS Choice        Expected Discharge Plan and Services                                                Prior Living Arrangements/Services                       Activities of Daily Living Home Assistive Devices/Equipment: None ADL Screening (condition at time of admission) Patient's cognitive ability adequate to safely complete daily activities?: Yes Is the patient deaf or have difficulty hearing?: No Does the patient have difficulty seeing, even when wearing glasses/contacts?: No Does the patient have difficulty concentrating, remembering, or making decisions?: No Patient able to express need for assistance with ADLs?: Yes Does the patient have difficulty dressing or bathing?: No Independently performs ADLs?: Yes (appropriate for developmental  age) Does the patient have difficulty walking or climbing stairs?: No Weakness of Legs: None Weakness of Arms/Hands: None  Permission Sought/Granted                  Emotional Assessment              Admission diagnosis:  Encounter for elective induction of labor [Z34.90] Patient Active Problem List   Diagnosis Date Noted   Encounter for elective induction of labor 02/10/2021   Rh negative state in antepartum period 11/20/2020   PCP:  Augustina Mood, MD Pharmacy:   [facility] - [place], [place] - [address] [place] [address] Phone: [phone] Fax: [phone]     Social Determinants of Health (SDOH) Interventions    Readmission Risk Interventions No flowsheet data found.

## 2021-02-11 NOTE — Lactation Note (Signed)
This note was copied from a baby's chart. Lactation Consultation Note  Patient Name: [name] [id number] Date: 02/11/2021 Reason for consult: Follow-up assessment;1st time breastfeeding;Term Age:32 hours  Lactation follow-up. Feedings and output documented overnight. Mom is worried that baby is not eating often and that she may have reflux. We discussed normal newborn feeding patterns in first 24 hours, amniotic fluid that baby swallowed may be causing the gagging and creating no desire to eat at this time. Encouraged skin to skin, hand expression, and offering every 2-3 hours. We discussed potential for the onset of cluster feeding around 24 hours as baby begins to recover from delivery.  Reiterated BF support available today, encouraged to call as needed.  Maternal Data Has patient been taught Hand Expression?: Yes Does the patient have breastfeeding experience prior to this delivery?: No  Feeding Mother's Current Feeding Choice: Breast Milk and Formula  LATCH Score                    Lactation Tools Discussed/Used    Interventions Interventions: Breast feeding basics reviewed;Skin to skin;Education  Discharge    Consult Status Consult Status: Follow-up Date: 02/11/21 Follow-up type: In-patient    Danford Bad 02/11/2021, 11:15 AM

## 2021-02-11 NOTE — Progress Notes (Signed)
Pt discharged with infant.  Discharge instructions, prescriptions and follow up appointment given to and reviewed with pt. Pt verbalized understanding. Escorted out by staff. 

## 2021-02-11 NOTE — Anesthesia Postprocedure Evaluation (Signed)
Anesthesia Post Note  Patient: [name]  Procedure(s) Performed: AN AD HOC LABOR EPIDURAL  Patient location during evaluation: Mother Baby Anesthesia Type: Epidural Level of consciousness: awake and alert Pain management: pain level controlled Vital Signs Assessment: post-procedure vital signs reviewed and stable Respiratory status: spontaneous breathing, nonlabored ventilation and respiratory function stable Cardiovascular status: stable Postop Assessment: no headache, no backache and epidural receding Anesthetic complications: no   No notable events documented.   Last Vitals:  Vitals:   02/10/21 2256 02/11/21 0419  BP: 110/68 119/81  Pulse: 69 65  Resp: 18 18  Temp: 36.8 C 36.7 C  SpO2: 98% 99%    Last Pain:  Vitals:   02/11/21 0419  TempSrc: Oral  PainSc:                  Rosanne Gutting

## 2021-02-12 ENCOUNTER — Encounter: Payer: Self-pay | Admitting: Obstetrics and Gynecology

## 2021-02-12 ENCOUNTER — Telehealth: Payer: Self-pay | Admitting: Obstetrics and Gynecology

## 2021-02-12 NOTE — Telephone Encounter (Signed)
Pt is calling in stating that she is having a lot of back pain and the ibuprofen and tylenol is not working and would like to see if there is something else that she is able to take to ease the pain just on the L side of her back.  Pharm:  [facility] in [place].  Pt state that if you are not able to speak with her you are more than welcome to leave a msg on [place]  May also leave a msg on her myChart.

## 2021-02-12 NOTE — Telephone Encounter (Signed)
Pt is calling back to see if there is anything that she can do for the back pain that she is experiencing.  Pt stated that she is breast feeding her baby (girl) and would like to see if she can get the same medication that she had when she was in the hospital.  Pt stated that she is not able to get any rest.  Pt would like to have a call back.

## 2021-02-12 NOTE — Discharge Summary (Signed)
Patient Name: [name] DOB: 1990/04/08 MRN: [id number]                            Discharge Summary  Date of Admission: 02/10/2021 Date of Discharge: 02/12/2021 Delivering Provider: Linzie Collin   Admitting Diagnosis: Encounter for elective induction of labor [Z34.90] at [redacted]w[redacted]d Secondary diagnosis:  Principal Problem:   Encounter for elective induction of labor   Mode of Delivery: normal spontaneous vaginal delivery              Discharge diagnosis: Term Pregnancy Delivered    Viable infant  Intrapartum Procedures: Atificial rupture of membranes   Post partum procedures:   Complications: none                     Discharge Day SOAP Note:  Progress Note - Vaginal Delivery  [name] is a 30 y.o. G3P3003 now PP day 1 s/p Vaginal, Spontaneous . Delivery was uncomplicated  Subjective  The patient has the following complaints: has no unusual complaints  Pain is controlled with current medications.   Patient is urinating without difficulty.  She is ambulating well.     Objective  Vital signs: BP (!) 142/90 (BP Location: Right Arm)   Pulse 70   Temp 98.4 F (36.9 C) (Oral)   Resp 20   Ht 5\' 5"  (1.651 m)   Wt 73 kg   LMP 05/23/2020   SpO2 98%   Breastfeeding Unknown   BMI 26.79 kg/m   Physical Exam: Gen: NAD Fundus Fundal Tone: Firm  Lochia Amount: Small        Data Review Labs: Lab Results  Component Value Date   WBC 13.1 (H) 02/10/2021   HGB 10.9 (L) 02/10/2021   HCT 31.7 (L) 02/10/2021   MCV 94.1 02/10/2021   PLT 264 02/10/2021   CBC Latest Ref Rng & Units 02/10/2021 11/20/2020 09/11/2006  WBC 4.0 - 10.5 K/uL 13.1(H) 9.2 14.0(H)  Hemoglobin 12.0 - 15.0 g/dL 10.9(L) 10.0(L) 10.3(L)  Hematocrit 36.0 - 46.0 % 31.7(L) 28.7(L) 29.5(L)  Platelets 150 - 400 K/uL 264 214 170   O NEG  Edinburgh Score: Edinburgh Postnatal Depression Scale Screening Tool 02/11/2021  I have been able to laugh and see the funny side of things. 0  I have  looked forward with enjoyment to things. 0  I have blamed myself unnecessarily when things went wrong. 0  I have been anxious or worried for no good reason. 0  I have felt scared or panicky for no good reason. 0  Things have been getting on top of me. 0  I have been so unhappy that I have had difficulty sleeping. 0  I have felt sad or miserable. 0  I have been so unhappy that I have been crying. 0  The thought of harming myself has occurred to me. 0  Edinburgh Postnatal Depression Scale Total 0    Assessment/Plan  Principal Problem:   Encounter for elective induction of labor    Plan for discharge today.  Discharge Instructions: Per After Visit Summary. Activity: Advance as tolerated. Pelvic rest for 6 weeks.  Also refer to After Visit Summary Diet: Regular Medications: Allergies as of 02/11/2021       Reactions   Hydrocodone Itching   Penicillins Other (See Comments)   unknown        Medication List     STOP taking these medications  acyclovir 200 MG capsule Commonly known as: ZOVIRAX   acyclovir 400 MG tablet Commonly known as: ZOVIRAX       TAKE these medications    ibuprofen 600 MG tablet Commonly known as: ADVIL Take 1 tablet (600 mg total) by mouth every 6 (six) hours.   Vitafol Gummies 3.33-0.333-34.8 MG Chew Chew 3 tablets by mouth daily.       Outpatient follow up:   Follow-up Information     Harlin Heys, MD Follow up in 2 week(s).   Specialties: Obstetrics and Gynecology, Radiology Why: may do video visit Contact information: [address] [place] [address] [phone]                Postpartum contraception: Will discuss at first office visit post-partum  Discharged Condition: good  Discharged to: home  Newborn Data: Disposition:home with mother  Apgars: APGAR (1 MIN): 9   APGAR (5 MINS): 9   APGAR (10 MINS):    Baby Feeding: Breast    Finis Bud, M.D. 02/12/2021 11:25 AM

## 2021-02-13 ENCOUNTER — Telehealth: Payer: Self-pay | Admitting: Obstetrics and Gynecology

## 2021-02-13 ENCOUNTER — Other Ambulatory Visit: Payer: Self-pay | Admitting: Obstetrics and Gynecology

## 2021-02-13 DIAGNOSIS — M545 Low back pain, unspecified: Secondary | ICD-10-CM

## 2021-02-13 LAB — TYPE AND SCREEN
ABO/RH(D): O NEG
Antibody Screen: POSITIVE

## 2021-02-13 MED ORDER — OXYCODONE-ACETAMINOPHEN 5-325 MG PO TABS
1.0000 | ORAL_TABLET | Freq: Four times a day (QID) | ORAL | 0 refills | Status: DC | PRN
Start: 2021-02-13 — End: 2021-02-13

## 2021-02-13 MED ORDER — OXYCODONE-ACETAMINOPHEN 5-325 MG PO TABS: 1.0000 | ORAL_TABLET | Freq: Four times a day (QID) | ORAL | 0 refills | Status: DC | PRN

## 2021-02-13 NOTE — Telephone Encounter (Signed)
Pt called about being in pain, pt was following up on message- I explained that nurse is consulting about message this morning and will get in touch regarding concerns. Pt wanted to add that she woke up engorged and wants to stop breast feeding. I mentioned using hot compresses, or cabbage leaves to help.

## 2021-02-13 NOTE — Telephone Encounter (Signed)
Patient has been advised via mychart 

## 2021-02-13 NOTE — Telephone Encounter (Signed)
I have sent patient mychart advise from Dr. Logan Bores.

## 2021-02-25 ENCOUNTER — Telehealth: Payer: Medicaid Other | Admitting: Obstetrics and Gynecology

## 2021-03-09 NOTE — L&D Delivery Note (Signed)
Delivery Note At 2112,  a viable female was delivered vaginally, in one push  (Presentation:   OA with restitution to ROT   ).  APGAR:7, 9, ; weight  pending.   Placenta status: delivered intact at , 2119 .  Cord: 3 vessel.  with the following complications: preterm. Bloody amniotic fluid noted at delivery .  Cord pH: intact  Anesthesia:  epidural Episiotomy:  none Lacerations:  none Suture Repair:  NA Est. Blood Loss (mL):  100  Mom to postpartum.  Baby to NICU The placenta was sent to pathology for evaluation secondary to the premature delivery.  Mirna Mires 01/20/2022, 9:58 PM

## 2021-03-25 ENCOUNTER — Encounter: Payer: Self-pay | Admitting: Obstetrics and Gynecology

## 2021-03-25 ENCOUNTER — Ambulatory Visit (INDEPENDENT_AMBULATORY_CARE_PROVIDER_SITE_OTHER): Payer: Medicaid Other | Admitting: Obstetrics and Gynecology

## 2021-03-25 ENCOUNTER — Other Ambulatory Visit: Payer: Self-pay

## 2021-03-25 NOTE — Progress Notes (Signed)
HPI:      [name] is a 31 y.o. [phone] who LMP was Patient's last menstrual period was 05/23/2020.  Subjective:   She presents today approximately 6 weeks postpartum.  She is doing well.  She reports that she has not bled in 3 weeks.  She is bottlefeeding.  She states that she had a short episode of postpartum depression but this has completely resolved.  She is not using Zoloft. She desires Nexplanon for birth control.  She has used Nexplanon before and was happy with it.    Hx: The following portions of the patient's history were reviewed and updated as appropriate:             She  has a past medical history of Depression, Herpes, HSV (herpes simplex virus) infection, Medical history non-contributory, and Placenta previa. She does not have any pertinent problems on file. She  has a past surgical history that includes Facial reconstruction surgery (2014). Her family history includes Dementia in her maternal grandfather; Diabetes in her paternal grandmother; Hypertension in her mother. She  reports that she has never smoked. She has never used smokeless tobacco. She reports that she does not currently use alcohol. She reports current drug use. Drug: Marijuana. She has a current medication list which includes the following prescription(s): vitafol gummies, ibuprofen, and oxycodone-acetaminophen. She is allergic to hydrocodone and penicillins.       Review of Systems:  Review of Systems  Constitutional: Denied constitutional symptoms, night sweats, recent illness, fatigue, fever, insomnia and weight loss.  Eyes: Denied eye symptoms, eye pain, photophobia, vision change and visual disturbance.  Ears/Nose/Throat/Neck: Denied ear, nose, throat or neck symptoms, hearing loss, nasal discharge, sinus congestion and sore throat.  Cardiovascular: Denied cardiovascular symptoms, arrhythmia, chest pain/pressure, edema, exercise intolerance, orthopnea and palpitations.  Respiratory: Denied  pulmonary symptoms, asthma, pleuritic pain, productive sputum, cough, dyspnea and wheezing.  Gastrointestinal: Denied, gastro-esophageal reflux, melena, nausea and vomiting.  Genitourinary: Denied genitourinary symptoms including symptomatic vaginal discharge, pelvic relaxation issues, and urinary complaints.  Musculoskeletal: Denied musculoskeletal symptoms, stiffness, swelling, muscle weakness and myalgia.  Dermatologic: Denied dermatology symptoms, rash and scar.  Neurologic: Denied neurology symptoms, dizziness, headache, neck pain and syncope.  Psychiatric: Denied psychiatric symptoms, anxiety and depression.  Endocrine: Denied endocrine symptoms including hot flashes and night sweats.   Meds:   Current Outpatient Medications on File Prior to Visit  Medication Sig Dispense Refill   Prenatal Vit-Fe Phos-FA-Omega (VITAFOL GUMMIES) 3.33-0.333-34.8 MG CHEW Chew 3 tablets by mouth daily. 90 tablet 11   ibuprofen (ADVIL) 600 MG tablet Take 1 tablet (600 mg total) by mouth every 6 (six) hours. (Patient not taking: Reported on 03/25/2021) 30 tablet 0   oxyCODONE-acetaminophen (PERCOCET/ROXICET) 5-325 MG tablet Take 1-2 tablets by mouth every 6 (six) hours as needed. (Patient not taking: Reported on 03/25/2021) 10 tablet 0   No current facility-administered medications on file prior to visit.      Objective:     Vitals:   03/25/21 1426  BP: 119/73  Pulse: 85   Filed Weights   03/25/21 1426  Weight: 140 lb (63.5 kg)              Physical examination   Pelvic:   Vulva: Normal appearance.  No lesions.  Vagina: No lesions or abnormalities noted.  Support: Normal pelvic support.  Urethra No masses tenderness or scarring.  Meatus Normal size without lesions or prolapse.  Cervix: Normal appearance.  No lesions.  Anus: Normal exam.  No lesions.  Perineum: Normal exam.  No lesions.        Bimanual   Uterus: Normal size.  Non-tender.  Mobile.  AV.  Adnexae: No masses.  Non-tender to  palpation.  Cul-de-sac: Negative for abnormality.             Assessment:    G3P3003 Patient Active Problem List   Diagnosis Date Noted   Encounter for elective induction of labor 02/10/2021   Rh negative state in antepartum period 11/20/2020     1. Postpartum care and examination immediately after delivery     Normal exam-no issues   Plan:            1.  May resume normal activities with exception of heavy lifting  2.  To return for Nexplanon insertion. Orders No orders of the defined types were placed in this encounter.   No orders of the defined types were placed in this encounter.     F/U  Return in about 2 weeks (around 04/08/2021).  Finis Bud, M.D. 03/25/2021 2:51 PM

## 2021-04-09 ENCOUNTER — Encounter: Payer: Medicaid Other | Admitting: Obstetrics and Gynecology

## 2021-05-06 ENCOUNTER — Other Ambulatory Visit: Payer: Self-pay

## 2021-05-06 ENCOUNTER — Encounter: Payer: Medicaid Other | Admitting: Obstetrics and Gynecology

## 2021-05-06 NOTE — Progress Notes (Unsigned)
° ° °  GYNECOLOGY PROGRESS NOTE  Subjective:    Patient ID: [name], female    DOB: December 16, 1990, 31 y.o.   MRN: [id number]  HPI  Patient is a 31 y.o. G24P3003 female who presents for Nexplanon insertion after delivery.   {Common ambulatory SmartLinks:19316}  Review of Systems {ros; complete:30496}   Objective:   not currently breastfeeding. There is no height or weight on file to calculate BMI. General appearance: {general exam:16600} Abdomen: {abdominal exam:16834} Pelvic: {pelvic exam:16852::"cervix normal in appearance","external genitalia normal","no adnexal masses or tenderness","no cervical motion tenderness","rectovaginal septum normal","uterus normal size, shape, and consistency","vagina normal without discharge"} Extremities: {extremity exam:5109} Neurologic: {neuro exam:17854}   Assessment:   No diagnosis found.   Plan:   There are no diagnoses linked to this encounter.

## 2021-07-17 ENCOUNTER — Encounter: Payer: Medicaid Other | Admitting: Obstetrics and Gynecology

## 2021-07-17 DIAGNOSIS — Z3200 Encounter for pregnancy test, result unknown: Secondary | ICD-10-CM

## 2021-07-30 ENCOUNTER — Ambulatory Visit (INDEPENDENT_AMBULATORY_CARE_PROVIDER_SITE_OTHER): Payer: Medicaid Other | Admitting: Obstetrics and Gynecology

## 2021-07-30 ENCOUNTER — Other Ambulatory Visit: Payer: Self-pay

## 2021-07-30 ENCOUNTER — Encounter: Payer: Self-pay | Admitting: Obstetrics and Gynecology

## 2021-07-30 VITALS — BP 124/76 | HR 79 | Ht 65.0 in | Wt 140.0 lb

## 2021-07-30 DIAGNOSIS — Z3200 Encounter for pregnancy test, result unknown: Secondary | ICD-10-CM | POA: Diagnosis not present

## 2021-07-30 LAB — POCT URINE PREGNANCY: Preg Test, Ur: POSITIVE — AB

## 2021-07-30 NOTE — Progress Notes (Signed)
Patient presents today for pregnancy confirmation. UPT resulted in positive. Patient states no other questions or concerns.

## 2021-07-30 NOTE — Progress Notes (Signed)
HPI:      [name] is a 31 y.o. [phone] who LMP was Patient's last menstrual period was 05/26/2021.  Subjective:   She presents today for pregnancy confirmation.  She believes she is approximately 9 weeks estimated gestational age by last menstrual period.  She had a recent vaginal delivery in December approximately 5 to 6 months ago. She has some nausea without significant vomiting.  She is currently taking prenatal vitamins.    Hx: The following portions of the patient's history were reviewed and updated as appropriate:             She  has a past medical history of Depression, Herpes, HSV (herpes simplex virus) infection, Medical history non-contributory, and Placenta previa. She does not have any pertinent problems on file. She  has a past surgical history that includes Facial reconstruction surgery (2014). Her family history includes Dementia in her maternal grandfather; Diabetes in her paternal grandmother; Hypertension in her mother. She  reports that she has never smoked. She has never used smokeless tobacco. She reports that she does not currently use alcohol. She reports current drug use. Drug: Marijuana. She has a current medication list which includes the following prescription(s): vitafol gummies. She is allergic to hydrocodone and penicillins.       Review of Systems:  Review of Systems  Constitutional: Denied constitutional symptoms, night sweats, recent illness, fatigue, fever, insomnia and weight loss.  Eyes: Denied eye symptoms, eye pain, photophobia, vision change and visual disturbance.  Ears/Nose/Throat/Neck: Denied ear, nose, throat or neck symptoms, hearing loss, nasal discharge, sinus congestion and sore throat.  Cardiovascular: Denied cardiovascular symptoms, arrhythmia, chest pain/pressure, edema, exercise intolerance, orthopnea and palpitations.  Respiratory: Denied pulmonary symptoms, asthma, pleuritic pain, productive sputum, cough, dyspnea and  wheezing.  Gastrointestinal: Denied, gastro-esophageal reflux, melena, nausea and vomiting.  Genitourinary: Denied genitourinary symptoms including symptomatic vaginal discharge, pelvic relaxation issues, and urinary complaints.  Musculoskeletal: Denied musculoskeletal symptoms, stiffness, swelling, muscle weakness and myalgia.  Dermatologic: Denied dermatology symptoms, rash and scar.  Neurologic: Denied neurology symptoms, dizziness, headache, neck pain and syncope.  Psychiatric: Denied psychiatric symptoms, anxiety and depression.  Endocrine: Denied endocrine symptoms including hot flashes and night sweats.   Meds:   Current Outpatient Medications on File Prior to Visit  Medication Sig Dispense Refill   Prenatal Vit-Fe Phos-FA-Omega (VITAFOL GUMMIES) 3.33-0.333-34.8 MG CHEW Chew 3 tablets by mouth daily. 90 tablet 11   No current facility-administered medications on file prior to visit.      Objective:     Vitals:   07/30/21 1432  BP: 124/76  Pulse: 79   Filed Weights   07/30/21 1432  Weight: 140 lb (63.5 kg)              Urine pregnancy test positive          Assessment:    [id number] Patient Active Problem List   Diagnosis Date Noted   Encounter for elective induction of labor 02/10/2021   Rh negative state in antepartum period 11/20/2020     1. Possible pregnancy, not yet confirmed     Approximately 9 weeks estimated gestational age based on LMP.   Plan:            Prenatal Plan 1.  The patient was given prenatal literature. 2.  She was continued on prenatal vitamins. 3.  A prenatal lab panel to be drawn at nurse visit. 4.  An ultrasound was ordered to better determine an EDC. 5.  A  nurse visit was scheduled. 6.  Genetic testing and testing for other inheritable conditions discussed in detail. She will decide in the future whether to have these labs performed. 7.  A general overview of pregnancy testing, visit schedule, ultrasound schedule, and prenatal  care was discussed.    Orders Orders Placed This Encounter  Procedures   US OB Comp Less 14 Wks   POCT urine pregnancy    No orders of the defined types were placed in this encounter.     F/U  Return in about 2 weeks (around 08/13/2021). I spent 23 minutes involved in the care of this patient preparing to see the patient by obtaining and reviewing her medical history (including labs, imaging tests and prior procedures), documenting clinical information in the electronic health record (EHR), counseling and coordinating care plans, writing and sending prescriptions, ordering tests or procedures and in direct communicating with the patient and medical staff discussing pertinent items from her history and physical exam.  Elonda Husky, M.D. 07/30/2021 2:49 PM

## 2021-08-15 ENCOUNTER — Telehealth: Payer: Self-pay | Admitting: Obstetrics and Gynecology

## 2021-08-15 ENCOUNTER — Ambulatory Visit (INDEPENDENT_AMBULATORY_CARE_PROVIDER_SITE_OTHER): Payer: Medicaid Other

## 2021-08-15 ENCOUNTER — Other Ambulatory Visit: Payer: Medicaid Other

## 2021-08-15 ENCOUNTER — Other Ambulatory Visit: Payer: Self-pay

## 2021-08-15 ENCOUNTER — Ambulatory Visit: Payer: Medicaid Other

## 2021-08-15 DIAGNOSIS — T7589XA Other specified effects of external causes, initial encounter: Secondary | ICD-10-CM

## 2021-08-15 DIAGNOSIS — Z3687 Encounter for antenatal screening for uncertain dates: Secondary | ICD-10-CM

## 2021-08-15 DIAGNOSIS — Z3481 Encounter for supervision of other normal pregnancy, first trimester: Secondary | ICD-10-CM

## 2021-08-15 DIAGNOSIS — Z3A11 11 weeks gestation of pregnancy: Secondary | ICD-10-CM

## 2021-08-15 DIAGNOSIS — Z1379 Encounter for other screening for genetic and chromosomal anomalies: Secondary | ICD-10-CM

## 2021-08-15 DIAGNOSIS — Z3A10 10 weeks gestation of pregnancy: Secondary | ICD-10-CM

## 2021-08-15 DIAGNOSIS — Z3200 Encounter for pregnancy test, result unknown: Secondary | ICD-10-CM

## 2021-08-15 DIAGNOSIS — Z113 Encounter for screening for infections with a predominantly sexual mode of transmission: Secondary | ICD-10-CM

## 2021-08-15 NOTE — Telephone Encounter (Signed)
error 

## 2021-08-15 NOTE — Telephone Encounter (Signed)
Pt came to her appointment for a [place] upon check in Pt states she had 2 appointments scheduled for today. 1 for labs and 1 for [place]. Pt was then notified that she had 1 appointment scheduled for today, and that appointment was for an [place]. Pt was still standing at check in  disputing she had indeed 2 scheduled  appts. Receptionist Arlisha, asked her to give her a moment while she checked into it further. I could overhear her frustration , I placed my call on hold and walked towards check in . Once I realized the pt name, I informed Ms. Kalis that I had cancelled her appt and left a detail message explaining that she did not need to arrive for her lab appt. I informed the pt that her [place] appt was still scheduled and her labs  would be provided per new protocol  at her OB appt. Again, I reiterated she still would receive her [place] today.  I explained per new protocol my manager had informed me on 08/14/21 she was scheduled incorrectly and asked to reschedule. I apologized again for the inconvenience and per my manager and schedule changes.  I had followed protocol/instruction but understood her frustration and again apologized. Pt stated we did not care about her pregnancy and how important labs were.  While I was speaking with Ms. Hamel. Receptionist  Arlisha had asked CMA Lovena Le could we still see Ms. Rosenow for labs. Labs were completed at this visit - Orders placed per CMA Taylor.

## 2021-08-16 ENCOUNTER — Encounter: Payer: Self-pay | Admitting: Obstetrics and Gynecology

## 2021-08-16 LAB — RUBELLA SCREEN: Rubella Antibodies, IGG: 3.09 index (ref >0.99)

## 2021-08-16 LAB — HIV ANTIBODY (ROUTINE TESTING W REFLEX): HIV Screen 4th Generation wRfx: NONREACTIVE

## 2021-08-16 LAB — URINALYSIS, ROUTINE W REFLEX MICROSCOPIC
Bilirubin, UA: NEGATIVE
Glucose, UA: NEGATIVE
Ketones, UA: NEGATIVE
Leukocytes,UA: NEGATIVE
Nitrite, UA: POSITIVE — AB
Protein,UA: NEGATIVE
RBC, UA: NEGATIVE
Specific Gravity, UA: 1.02 (ref 1.005–1.030)
Urobilinogen, Ur: 0.2 mg/dL (ref 0.2–1.0)
pH, UA: 6.5 (ref 5.0–7.5)

## 2021-08-16 LAB — VIRAL HEPATITIS HBV, HCV
HCV Ab: NONREACTIVE
Hep B Core Total Ab: NEGATIVE
Hep B Surface Ab, Qual: REACTIVE
Hepatitis B Surface Ag: NEGATIVE

## 2021-08-16 LAB — MICROSCOPIC EXAMINATION
Casts: NONE SEEN /lpf
RBC, Urine: NONE SEEN /hpf (ref 0–2)
WBC, UA: NONE SEEN /hpf (ref 0–5)

## 2021-08-16 LAB — RPR: RPR Ser Ql: NONREACTIVE

## 2021-08-16 LAB — ANTIBODY SCREEN: Antibody Screen: NEGATIVE

## 2021-08-16 LAB — VARICELLA ZOSTER ANTIBODY, IGG: Varicella zoster IgG: 645 index (ref >165)

## 2021-08-16 LAB — HCV INTERPRETATION

## 2021-08-16 LAB — ABO AND RH: Rh Factor: NEGATIVE

## 2021-08-16 LAB — TOXOPLASMA ANTIBODIES- IGG AND  IGM
Toxoplasma Antibody- IgM: <3 AU/mL (ref 0.0–7.9)
Toxoplasma IgG Ratio: <3 IU/mL (ref 0.0–7.1)

## 2021-08-16 LAB — HEMOGLOBIN A1C
Est. average glucose Bld gHb Est-mCnc: 100 mg/dL
Hgb A1c MFr Bld: 5.1 % (ref 4.8–5.6)

## 2021-08-17 NOTE — Progress Notes (Signed)
Simply because of the large bacteria and positive nitrites I believe results are c/w UTI - needs Rx: MacroBid 1 PO BID for 7 days

## 2021-08-18 ENCOUNTER — Other Ambulatory Visit: Payer: Self-pay

## 2021-08-18 DIAGNOSIS — O2341 Unspecified infection of urinary tract in pregnancy, first trimester: Secondary | ICD-10-CM

## 2021-08-18 MED ORDER — NITROFURANTOIN MONOHYD MACRO 100 MG PO CAPS: 100.0000 mg | ORAL_CAPSULE | Freq: Two times a day (BID) | ORAL | 0 refills | Status: DC

## 2021-08-19 LAB — PAIN MGT SCRN (14 DRUGS), UR
Amphetamine Scrn, Ur: NEGATIVE ng/mL
BARBITURATE SCREEN URINE: NEGATIVE ng/mL
BENZODIAZEPINE SCREEN, URINE: NEGATIVE ng/mL
Buprenorphine, Urine: POSITIVE ng/mL — AB
CANNABINOIDS UR QL SCN: POSITIVE ng/mL — AB
Cocaine (Metab) Scrn, Ur: NEGATIVE ng/mL
Creatinine(Crt), U: 101.6 mg/dL (ref 20.0–300.0)
Fentanyl, Urine: NEGATIVE pg/mL
Meperidine Screen, Urine: NEGATIVE ng/mL
Methadone Screen, Urine: NEGATIVE ng/mL
OXYCODONE+OXYMORPHONE UR QL SCN: NEGATIVE ng/mL
Opiate Scrn, Ur: NEGATIVE ng/mL
Ph of Urine: 6.7 (ref 4.5–8.9)
Phencyclidine Qn, Ur: NEGATIVE ng/mL
Propoxyphene Scrn, Ur: NEGATIVE ng/mL
Tramadol Screen, Urine: NEGATIVE ng/mL

## 2021-08-19 LAB — URINE CULTURE, OB REFLEX

## 2021-08-19 LAB — GC/CHLAMYDIA PROBE AMP
Chlamydia trachomatis, NAA: NEGATIVE
Neisseria Gonorrhoeae by PCR: NEGATIVE

## 2021-08-19 LAB — NICOTINE SCREEN, URINE: Cotinine Ql Scrn, Ur: NEGATIVE ng/mL

## 2021-08-19 LAB — CULTURE, OB URINE

## 2021-08-22 ENCOUNTER — Telehealth: Payer: Self-pay | Admitting: Obstetrics and Gynecology

## 2021-08-22 ENCOUNTER — Other Ambulatory Visit: Payer: Self-pay

## 2021-08-22 DIAGNOSIS — Z3481 Encounter for supervision of other normal pregnancy, first trimester: Secondary | ICD-10-CM

## 2021-08-22 LAB — MATERNIT21  PLUS CORE+ESS+SCA, BLOOD

## 2021-08-22 MED ORDER — VITAFOL GUMMIES 3.33-0.333-34.8 MG PO CHEW: 3.0000 | CHEWABLE_TABLET | Freq: Every day | ORAL | 11 refills | Status: DC

## 2021-08-22 NOTE — Telephone Encounter (Signed)
Pt is requesting a refill on Prenatal vitamins. Pt states she is completely out and her RX has expired .  Pt is asking that you send a my chart message once sent

## 2021-08-22 NOTE — Telephone Encounter (Addendum)
Prenatal vitamins refilled per protocol. Patient made aware.

## 2021-09-03 ENCOUNTER — Encounter: Payer: Medicaid Other | Admitting: Obstetrics and Gynecology

## 2021-09-03 ENCOUNTER — Telehealth: Payer: Self-pay | Admitting: Obstetrics and Gynecology

## 2021-09-03 NOTE — Telephone Encounter (Signed)
Just FYI, this patient did not show for her NOB physical with me today.

## 2021-09-22 ENCOUNTER — Ambulatory Visit (INDEPENDENT_AMBULATORY_CARE_PROVIDER_SITE_OTHER): Payer: Medicaid Other | Admitting: Obstetrics

## 2021-09-22 ENCOUNTER — Encounter: Payer: Self-pay | Admitting: Obstetrics

## 2021-09-22 VITALS — BP 114/70 | HR 80 | Wt 139.3 lb

## 2021-09-22 DIAGNOSIS — Z3482 Encounter for supervision of other normal pregnancy, second trimester: Secondary | ICD-10-CM | POA: Diagnosis not present

## 2021-09-22 DIAGNOSIS — Z3A17 17 weeks gestation of pregnancy: Secondary | ICD-10-CM | POA: Diagnosis not present

## 2021-09-22 LAB — POCT URINALYSIS DIPSTICK OB
Bilirubin, UA: NEGATIVE
Blood, UA: NEGATIVE
Glucose, UA: NEGATIVE
Ketones, UA: NEGATIVE
Leukocytes, UA: NEGATIVE
Nitrite, UA: NEGATIVE
POC,PROTEIN,UA: NEGATIVE
Spec Grav, UA: 1.02 (ref 1.010–1.025)
Urobilinogen, UA: 0.2 E.U./dL
pH, UA: 6 (ref 5.0–8.0)

## 2021-09-22 MED ORDER — ONDANSETRON HCL 4 MG PO TABS: 4.0000 mg | ORAL_TABLET | Freq: Three times a day (TID) | ORAL | 1 refills | Status: DC | PRN

## 2021-09-22 NOTE — Addendum Note (Signed)
Addended by: Guadlupe Spanish on: 09/22/2021 11:36 AM   Modules accepted: Orders

## 2021-09-22 NOTE — Progress Notes (Signed)
NEW OB HISTORY AND PHYSICAL  SUBJECTIVE:       [name] is a 31 y.o. [phone] female, Patient's last menstrual period was 05/26/2021., Estimated Date of Delivery: 03/02/22, [redacted]w[redacted]d, presents today for establishment of Prenatal Care. She reports nausea and food aversions.  She had about 3 periods after the birth of her daughter last December and then became pregnant.  Social history Partner/Relationship: FOB involved Living situation: lives with husband and 3 kids. Husband travels for work 50% of the time Work: [employer] Exercise: walking Substance use: MJ, Subutex. Weaning down.   Gynecologic History Patient's last menstrual period was 05/26/2021. Normal Contraception: none Last Pap: 08/20/20. Results were: normal cytology, negative HPV  Obstetric History OB History  Gravida Para Term Preterm AB Living  4 3 3  0 0 3  SAB IAB Ectopic Multiple Live Births  0 0 0 0 3    # Outcome Date GA Lbr Len/2nd Weight Sex Delivery Anes PTL Lv  4 Current           3 Term 02/10/21 [redacted]w[redacted]d 00:15 / 00:09 6 lb 7.7 oz (2.94 kg) F Vag-Spont EPI  LIV  2 Term 12/05/14   7 lb 8 oz (3.402 kg)  Vag-Spont   LIV     Complications: Placenta Previa  1 Term 09/10/06   7 lb 5 oz (3.317 kg)  Vag-Spont   LIV    Past Medical History:  Diagnosis Date   Depression    Herpes    HSV (herpes simplex virus) infection    Medical history non-contributory    Placenta previa     Past Surgical History:  Procedure Laterality Date   FACIAL RECONSTRUCTION SURGERY  2014    Current Outpatient Medications on File Prior to Visit  Medication Sig Dispense Refill   nitrofurantoin, macrocrystal-monohydrate, (MACROBID) 100 MG capsule Take 1 capsule (100 mg total) by mouth 2 (two) times daily. 14 capsule 0   Prenatal Vit-Fe Phos-FA-Omega (VITAFOL GUMMIES) 3.33-0.333-34.8 MG CHEW Chew 3 tablets by mouth daily. 90 tablet 11   No current facility-administered medications on file prior to visit.    Allergies  Allergen Reactions    Hydrocodone Itching   Penicillins Other (See Comments)    unknown    Social History   Socioeconomic History   Marital status: Married    Spouse name: [id number]   Number of children: Not on file   Years of education: Not on file   Highest education level: Not on file  Occupational History   Not on file  Tobacco Use   Smoking status: Never   Smokeless tobacco: Never  Vaping Use   Vaping Use: Never used  Substance and Sexual Activity   Alcohol use: Not Currently    Comment: occasionally   Drug use: Yes    Types: Marijuana   Sexual activity: Yes    Partners: Male    Birth control/protection: None  Other Topics Concern   Not on file  Social History Narrative   ** Merged History Encounter **       Social Determinants of Health   Financial Resource Strain: Not on file  Food Insecurity: Not on file  Transportation Needs: Not on file  Physical Activity: Not on file  Stress: Not on file  Social Connections: Not on file  Intimate Partner Violence: Not on file    Family History  Problem Relation Age of Onset   Hypertension Mother    Dementia Maternal Grandfather    Diabetes Paternal Grandmother  The following portions of the patient's history were reviewed and updated as appropriate: allergies, current medications, past OB history, past medical history, past surgical history, past family history, past social history, and problem list.  History obtained from the patient General ROS: negative for - chills or fever Psychological ROS: positive for - h/o depression after death of sister; bipolar treated as a teen, no longer on medication negative for - anxiety or depression Ophthalmic ROS: negative for - blurry vision or decreased vision ENT ROS: negative for - headaches or sore throat Hematological and Lymphatic ROS: negative for - bleeding problems, bruising, or swollen lymph nodes Endocrine ROS: negative for - breast changes or palpitations Breast ROS:  negative for breast lumps Respiratory ROS: no cough, shortness of breath, or wheezing Cardiovascular ROS: no chest pain or dyspnea on exertion Gastrointestinal ROS: no abdominal pain, change in bowel habits, or black or bloody stools Genito-Urinary ROS: no dysuria, trouble voiding, or hematuria Musculoskeletal ROS: negative Dermatological ROS: negative   OBJECTIVE: Initial Physical Exam (New OB)  GENERAL APPEARANCE: alert, well appearing, in no apparent distress, oriented to person, place and time HEAD: normocephalic, atraumatic MOUTH: mucous membranes moist, pharynx normal without lesions THYROID: no thyromegaly or masses present BREASTS: declined LUNGS: clear to auscultation, no wheezes, rales or rhonchi, symmetric air entry HEART: regular rate and rhythm, no murmurs ABDOMEN: soft, nontender, nondistended, no abnormal masses, no epigastric pain, FHT present (148 bpm, and fundus soft, non-tender, midway to umbilicus EXTREMITIES: no redness or tenderness in the calves or thighs, no edema SKIN: normal coloration and turgor, no rashes LYMPH NODES: no adenopathy palpable NEUROLOGIC: alert, oriented, normal speech, no focal findings or movement disorder noted  PELVIC EXAM deferred  ASSESSMENT: Normal pregnancy [redacted]w[redacted]d    PLAN: Routine prenatal care. We discussed an overview of prenatal care and when to call. Reviewed diet, exercise, and weight gain recommendations in pregnancy. Discussed benefits of breastfeeding and lactation resources at [facility]. Rx for Zofran to help with nausea. Discussed buprenorphine weaning (she is working with clinic to achieve this). Anatomy scan in 3-4 weeks. Discussed routine prenatal care. HSV prophylaxis at 39 weeks. Reviewed when and how to contat a provider. I reviewed labs and answered all questions.  See orders  Guadlupe Spanish, CNM

## 2021-09-29 NOTE — Telephone Encounter (Signed)
Made in errror

## 2021-10-13 ENCOUNTER — Ambulatory Visit (INDEPENDENT_AMBULATORY_CARE_PROVIDER_SITE_OTHER): Payer: Medicaid Other

## 2021-10-13 DIAGNOSIS — Z3689 Encounter for other specified antenatal screening: Secondary | ICD-10-CM

## 2021-10-13 DIAGNOSIS — Z3A17 17 weeks gestation of pregnancy: Secondary | ICD-10-CM | POA: Diagnosis not present

## 2021-10-16 ENCOUNTER — Encounter: Payer: Self-pay | Admitting: Certified Nurse Midwife

## 2021-10-16 MED ORDER — VALACYCLOVIR HCL 1 G PO TABS: 1000.0000 mg | ORAL_TABLET | Freq: Two times a day (BID) | ORAL | 0 refills | Status: AC

## 2021-10-21 ENCOUNTER — Encounter: Payer: Self-pay | Admitting: Obstetrics

## 2021-10-22 ENCOUNTER — Encounter: Payer: Medicaid Other | Admitting: Certified Nurse Midwife

## 2021-11-12 ENCOUNTER — Encounter: Payer: Medicaid Other | Admitting: Obstetrics

## 2021-11-21 ENCOUNTER — Ambulatory Visit (INDEPENDENT_AMBULATORY_CARE_PROVIDER_SITE_OTHER): Payer: Medicaid Other | Admitting: Obstetrics

## 2021-11-21 ENCOUNTER — Encounter: Payer: Self-pay | Admitting: Obstetrics

## 2021-11-21 VITALS — BP 116/68 | HR 78 | Wt 143.0 lb

## 2021-11-21 DIAGNOSIS — O99322 Drug use complicating pregnancy, second trimester: Secondary | ICD-10-CM

## 2021-11-21 DIAGNOSIS — F1120 Opioid dependence, uncomplicated: Secondary | ICD-10-CM

## 2021-11-21 DIAGNOSIS — Z3A25 25 weeks gestation of pregnancy: Secondary | ICD-10-CM | POA: Diagnosis not present

## 2021-11-21 DIAGNOSIS — O99320 Drug use complicating pregnancy, unspecified trimester: Secondary | ICD-10-CM

## 2021-11-21 LAB — POCT URINALYSIS DIPSTICK OB
Bilirubin, UA: NEGATIVE
Blood, UA: NEGATIVE
Glucose, UA: NEGATIVE
Ketones, UA: NEGATIVE
Leukocytes, UA: NEGATIVE
Nitrite, UA: NEGATIVE
POC,PROTEIN,UA: NEGATIVE
Spec Grav, UA: 1.02 (ref 1.010–1.025)
Urobilinogen, UA: 0.2 E.U./dL
pH, UA: 6.5 (ref 5.0–8.0)

## 2021-11-21 NOTE — Addendum Note (Signed)
Addended by: Guadlupe Spanish on: 11/21/2021 12:28 PM   Modules accepted: Orders

## 2021-11-21 NOTE — Progress Notes (Addendum)
ROB at [redacted]w[redacted]d. Active baby. Venise feels well, is enjoying this pregnancy. She is gaining some weight now; trying to incorporate sources of protein into her diet. Weaning down on Subutex and coping well. Will start q4 week growth scans at 28 weeks. Discussed glucose test, CBC, RPR, and UDS at next visit. RTC in 3 weeks.  Guadlupe Spanish, CNM

## 2021-11-24 ENCOUNTER — Telehealth: Payer: Self-pay | Admitting: Obstetrics

## 2021-11-24 NOTE — Telephone Encounter (Signed)
Attempted to call regarding Nadezhda's message. LVM to call back.  Cherylann Parr, CNM

## 2021-12-09 ENCOUNTER — Other Ambulatory Visit: Payer: Self-pay

## 2021-12-09 DIAGNOSIS — Z3A28 28 weeks gestation of pregnancy: Secondary | ICD-10-CM

## 2021-12-09 DIAGNOSIS — Z3483 Encounter for supervision of other normal pregnancy, third trimester: Secondary | ICD-10-CM

## 2021-12-09 DIAGNOSIS — Z131 Encounter for screening for diabetes mellitus: Secondary | ICD-10-CM

## 2021-12-10 ENCOUNTER — Other Ambulatory Visit: Payer: Medicaid Other

## 2021-12-10 ENCOUNTER — Ambulatory Visit (INDEPENDENT_AMBULATORY_CARE_PROVIDER_SITE_OTHER): Payer: Medicaid Other

## 2021-12-10 ENCOUNTER — Other Ambulatory Visit: Payer: Self-pay

## 2021-12-10 DIAGNOSIS — F1120 Opioid dependence, uncomplicated: Secondary | ICD-10-CM | POA: Diagnosis not present

## 2021-12-10 DIAGNOSIS — Z131 Encounter for screening for diabetes mellitus: Secondary | ICD-10-CM

## 2021-12-10 DIAGNOSIS — O99322 Drug use complicating pregnancy, second trimester: Secondary | ICD-10-CM | POA: Diagnosis not present

## 2021-12-10 DIAGNOSIS — Z3A28 28 weeks gestation of pregnancy: Secondary | ICD-10-CM

## 2021-12-10 DIAGNOSIS — Z3483 Encounter for supervision of other normal pregnancy, third trimester: Secondary | ICD-10-CM

## 2021-12-11 LAB — CBC WITH DIFFERENTIAL/PLATELET
Basophils Absolute: 0 10*3/uL (ref 0.0–0.2)
Basos: 0 %
EOS (ABSOLUTE): 0.1 10*3/uL (ref 0.0–0.4)
Eos: 1 %
Hematocrit: 32.1 % — ABNORMAL LOW (ref 34.0–46.6)
Hemoglobin: 11.1 g/dL (ref 11.1–15.9)
Immature Grans (Abs): 0 10*3/uL (ref 0.0–0.1)
Immature Granulocytes: 0 %
Lymphocytes Absolute: 1.1 10*3/uL (ref 0.7–3.1)
Lymphs: 11 %
MCH: 31.6 pg (ref 26.6–33.0)
MCHC: 34.6 g/dL (ref 31.5–35.7)
MCV: 92 fL (ref 79–97)
Monocytes Absolute: 0.3 10*3/uL (ref 0.1–0.9)
Monocytes: 3 %
Neutrophils Absolute: 8.3 10*3/uL — ABNORMAL HIGH (ref 1.4–7.0)
Neutrophils: 85 %
Platelets: 194 10*3/uL (ref 150–450)
RBC: 3.51 x10E6/uL — ABNORMAL LOW (ref 3.77–5.28)
RDW: 12.7 % (ref 11.7–15.4)
WBC: 9.9 10*3/uL (ref 3.4–10.8)

## 2021-12-11 LAB — GLUCOSE TOLERANCE, 1 HOUR: Glucose, 1Hr PP: 160 mg/dL (ref 70–199)

## 2021-12-11 LAB — RPR: RPR Ser Ql: NONREACTIVE

## 2021-12-17 ENCOUNTER — Ambulatory Visit (INDEPENDENT_AMBULATORY_CARE_PROVIDER_SITE_OTHER): Payer: Medicaid Other | Admitting: Obstetrics and Gynecology

## 2021-12-17 ENCOUNTER — Encounter: Payer: Self-pay | Admitting: Obstetrics and Gynecology

## 2021-12-17 VITALS — BP 107/74 | HR 83 | Wt 150.4 lb

## 2021-12-17 DIAGNOSIS — O99320 Drug use complicating pregnancy, unspecified trimester: Secondary | ICD-10-CM

## 2021-12-17 DIAGNOSIS — Z6791 Unspecified blood type, Rh negative: Secondary | ICD-10-CM | POA: Diagnosis not present

## 2021-12-17 DIAGNOSIS — O99323 Drug use complicating pregnancy, third trimester: Secondary | ICD-10-CM

## 2021-12-17 DIAGNOSIS — O0990 Supervision of high risk pregnancy, unspecified, unspecified trimester: Secondary | ICD-10-CM | POA: Insufficient documentation

## 2021-12-17 DIAGNOSIS — R825 Elevated urine levels of drugs, medicaments and biological substances: Secondary | ICD-10-CM

## 2021-12-17 DIAGNOSIS — Z3483 Encounter for supervision of other normal pregnancy, third trimester: Secondary | ICD-10-CM

## 2021-12-17 DIAGNOSIS — Z3A29 29 weeks gestation of pregnancy: Secondary | ICD-10-CM

## 2021-12-17 DIAGNOSIS — O09893 Supervision of other high risk pregnancies, third trimester: Secondary | ICD-10-CM | POA: Diagnosis not present

## 2021-12-17 DIAGNOSIS — F1120 Opioid dependence, uncomplicated: Secondary | ICD-10-CM

## 2021-12-17 DIAGNOSIS — Z23 Encounter for immunization: Secondary | ICD-10-CM

## 2021-12-17 DIAGNOSIS — Z7681 Expectant parent(s) prebirth pediatrician visit: Secondary | ICD-10-CM | POA: Diagnosis not present

## 2021-12-17 DIAGNOSIS — O99810 Abnormal glucose complicating pregnancy: Secondary | ICD-10-CM

## 2021-12-17 DIAGNOSIS — O26893 Other specified pregnancy related conditions, third trimester: Secondary | ICD-10-CM | POA: Diagnosis not present

## 2021-12-17 DIAGNOSIS — O09899 Supervision of other high risk pregnancies, unspecified trimester: Secondary | ICD-10-CM | POA: Insufficient documentation

## 2021-12-17 DIAGNOSIS — Z3182 Encounter for Rh incompatibility status: Secondary | ICD-10-CM

## 2021-12-17 LAB — POCT URINALYSIS DIPSTICK OB
Bilirubin, UA: NEGATIVE
Blood, UA: NEGATIVE
Glucose, UA: NEGATIVE
Ketones, UA: NEGATIVE
Leukocytes, UA: NEGATIVE
Nitrite, UA: NEGATIVE
Spec Grav, UA: 1.01 (ref 1.010–1.025)
Urobilinogen, UA: 0.2 E.U./dL
pH, UA: 8 (ref 5.0–8.0)

## 2021-12-17 MED ORDER — RHO D IMMUNE GLOBULIN 1500 UNIT/2ML IJ SOSY
300.0000 ug | PREFILLED_SYRINGE | Freq: Once | INTRAMUSCULAR | Status: AC
  Administered 2021-12-17: 300 ug via INTRAMUSCULAR

## 2021-12-17 NOTE — Progress Notes (Signed)
ROB: Patient without complaints. For 28 week labs today.  Plans to breastfeed, desires undecided (considering vasectomy vs BTL vs Nexplanon) for contraception.  Discussed risks/benefits of each one. Has used IUD in the past but had a bad experience (lots of cramping, had it removed). Did sign BTL forms today just in case. Desires circumcision for female infant.  For Tdap today, signed blood consent.  Rhogam administered. Patient notes she is continuing weaning from Suboxone (currently down to 1/8th of strip q 3 days). Plans to be off by end of the pregnancy.  Also discussed NAS syndrome and inpatient screening if she is unable to wean by the end of the pregnancy. Reviewed labs, 1 hr glucola was abnormal (160), to schedule for 3 hr GTT. Repeat UDS done today. Declines flu vaccine. RTC in 2 weeks. For growth scan in 4 weeks.

## 2021-12-17 NOTE — Progress Notes (Signed)
ROB: [redacted]w[redacted]d: She has no new concerns today. She reports good fetal movement.

## 2021-12-18 ENCOUNTER — Encounter: Payer: Self-pay | Admitting: Obstetrics and Gynecology

## 2021-12-19 LAB — PAIN MGT SCRN (14 DRUGS), UR
Amphetamine Scrn, Ur: NEGATIVE ng/mL
BARBITURATE SCREEN URINE: NEGATIVE ng/mL
BENZODIAZEPINE SCREEN, URINE: NEGATIVE ng/mL
Buprenorphine, Urine: POSITIVE ng/mL — AB
CANNABINOIDS UR QL SCN: POSITIVE ng/mL — AB
Cocaine (Metab) Scrn, Ur: NEGATIVE ng/mL
Creatinine(Crt), U: 74.6 mg/dL (ref 20.0–300.0)
Fentanyl, Urine: NEGATIVE pg/mL
Meperidine Screen, Urine: NEGATIVE ng/mL
Methadone Screen, Urine: NEGATIVE ng/mL
OXYCODONE+OXYMORPHONE UR QL SCN: NEGATIVE ng/mL
Opiate Scrn, Ur: NEGATIVE ng/mL
Ph of Urine: 8 (ref 4.5–8.9)
Phencyclidine Qn, Ur: NEGATIVE ng/mL
Propoxyphene Scrn, Ur: NEGATIVE ng/mL
Tramadol Screen, Urine: NEGATIVE ng/mL

## 2021-12-25 ENCOUNTER — Ambulatory Visit: Admission: RE | Admit: 2021-12-25 | Payer: Medicaid Other | Source: Ambulatory Visit

## 2021-12-26 ENCOUNTER — Telehealth: Payer: Self-pay | Admitting: Obstetrics and Gynecology

## 2021-12-26 ENCOUNTER — Other Ambulatory Visit: Payer: Medicaid Other

## 2021-12-26 NOTE — Telephone Encounter (Signed)
Contacted pt to reschedule 3 hr glucola test that was scheduled on 12/26/21 at 8:40.  Pt was rescheduled for 10/25 at 9:00.  Pt could not schedule any earlier bc she has to drop her son off at school.

## 2021-12-31 ENCOUNTER — Other Ambulatory Visit: Payer: Medicaid Other

## 2022-01-02 ENCOUNTER — Encounter: Payer: Self-pay | Admitting: Obstetrics

## 2022-01-05 ENCOUNTER — Other Ambulatory Visit: Payer: Medicaid Other

## 2022-01-05 ENCOUNTER — Telehealth: Payer: Self-pay

## 2022-01-09 NOTE — Telephone Encounter (Signed)
Sent mychart message with info.

## 2022-01-13 ENCOUNTER — Telehealth: Payer: Self-pay

## 2022-01-14 ENCOUNTER — Encounter: Payer: Medicaid Other | Admitting: Obstetrics and Gynecology

## 2022-01-14 ENCOUNTER — Other Ambulatory Visit: Payer: Medicaid Other

## 2022-01-14 DIAGNOSIS — Z3483 Encounter for supervision of other normal pregnancy, third trimester: Secondary | ICD-10-CM

## 2022-01-14 DIAGNOSIS — Z3A33 33 weeks gestation of pregnancy: Secondary | ICD-10-CM

## 2022-01-18 ENCOUNTER — Encounter: Payer: Self-pay | Admitting: Obstetrics and Gynecology

## 2022-01-18 DIAGNOSIS — B009 Herpesviral infection, unspecified: Secondary | ICD-10-CM | POA: Insufficient documentation

## 2022-01-19 ENCOUNTER — Observation Stay (HOSPITAL_BASED_OUTPATIENT_CLINIC_OR_DEPARTMENT_OTHER)
Admission: EM | Admit: 2022-01-19 | Discharge: 2022-01-19 | Disposition: A | Discharge status: Home / Self Care | Payer: Medicaid Other | Source: Home / Self Care | Admitting: Family

## 2022-01-19 ENCOUNTER — Encounter: Payer: Self-pay | Admitting: Obstetrics and Gynecology

## 2022-01-19 ENCOUNTER — Other Ambulatory Visit: Payer: Self-pay

## 2022-01-19 DIAGNOSIS — M549 Dorsalgia, unspecified: Secondary | ICD-10-CM | POA: Diagnosis present

## 2022-01-19 DIAGNOSIS — Z1152 Encounter for screening for COVID-19: Secondary | ICD-10-CM | POA: Insufficient documentation

## 2022-01-19 DIAGNOSIS — O26893 Other specified pregnancy related conditions, third trimester: Secondary | ICD-10-CM | POA: Diagnosis not present

## 2022-01-19 DIAGNOSIS — B349 Viral infection, unspecified: Secondary | ICD-10-CM | POA: Insufficient documentation

## 2022-01-19 DIAGNOSIS — N3001 Acute cystitis with hematuria: Secondary | ICD-10-CM

## 2022-01-19 DIAGNOSIS — O212 Late vomiting of pregnancy: Secondary | ICD-10-CM | POA: Insufficient documentation

## 2022-01-19 DIAGNOSIS — O98513 Other viral diseases complicating pregnancy, third trimester: Secondary | ICD-10-CM | POA: Insufficient documentation

## 2022-01-19 DIAGNOSIS — Z3A34 34 weeks gestation of pregnancy: Secondary | ICD-10-CM | POA: Insufficient documentation

## 2022-01-19 LAB — URINALYSIS, ROUTINE W REFLEX MICROSCOPIC
Bilirubin Urine: NEGATIVE
Glucose, UA: NEGATIVE mg/dL
Ketones, ur: NEGATIVE mg/dL
Nitrite: POSITIVE — AB
Protein, ur: 30 mg/dL — AB
Specific Gravity, Urine: 1.005 (ref 1.005–1.030)
WBC, UA: >50 WBC/hpf — ABNORMAL HIGH (ref 0–5)
pH: 6 (ref 5.0–8.0)

## 2022-01-19 LAB — RESP PANEL BY RT-PCR (FLU A&B, COVID) ARPGX2
Influenza A by PCR: NEGATIVE
Influenza B by PCR: NEGATIVE
SARS Coronavirus 2 by RT PCR: NEGATIVE

## 2022-01-19 MED ORDER — NITROFURANTOIN MONOHYD MACRO 100 MG PO CAPS: 100.0000 mg | ORAL_CAPSULE | Freq: Two times a day (BID) | ORAL | 0 refills | Status: AC

## 2022-01-19 MED ORDER — ONDANSETRON HCL 4 MG PO TABS: 4.0000 mg | ORAL_TABLET | Freq: Three times a day (TID) | ORAL | 0 refills | Status: DC | PRN

## 2022-01-19 NOTE — OB Triage Note (Signed)
Pt G4P3 34w presents for back pain, fever, vomiting, chills, body aches and weakness since last Monday. Pt reports temp of around 99.0 for the last week. Reports taking 600mg  ibuprofen 2-3x/day for the last week. Pt reports sore throat in the AM that resolves throughout the day. Pt reports only pain in her left lower back that she rates 9/10. Pt reports good fetal movement and denies LOF, bleeding, ctx. VSS. CNM made aware of pt.

## 2022-01-19 NOTE — OB Triage Note (Signed)
OB/triage note  Patient ID: Hagan Cedar MRN: [id number] DOB/AGE: 33-Jul-1992 33 y.o.  Admit date: 01/19/2022 Admitting provider: Maggie Font, CNM Discharge date: 01/19/2022   History of Present Illness: The patient is a 31 y.o. female G4P3003 at [redacted]w[redacted]d who presents for back pain for one week. Also reports RN, ST, cough, low grade fever not above 99. Has been taking ibuprofen. Also using lidocaine patches on back which help with pain. Back pain is throbbing, lower left side. RN/cough have improved throughout the week. Also endorses dysuria, denies frequency, suprapubic pain, flank pain. Family members at home have RN, cough, HA, ST. Did rapid home covid test which was negative two days ago. Also having nausea/vomiting intermittently. Has used zofran which has helped although only has a few pills left.. Illness is worse at night, last vomited early this morning. Has been able to drink today and keep down some food.   Past Medical History:  Diagnosis Date   Depression    HSV (herpes simplex virus) infection     Past Surgical History:  Procedure Laterality Date   FACIAL RECONSTRUCTION SURGERY  2014    No current facility-administered medications on file prior to encounter.   Current Outpatient Medications on File Prior to Encounter  Medication Sig Dispense Refill   ondansetron (ZOFRAN) 4 MG tablet Take 1 tablet (4 mg total) by mouth every 8 (eight) hours as needed for nausea or vomiting. 20 tablet 1   Prenatal Vit-Fe Phos-FA-Omega (VITAFOL GUMMIES) 3.33-0.333-34.8 MG CHEW Chew 3 tablets by mouth daily. 90 tablet 11   permethrin (ELIMITE) 5 % cream 1 application Externally once a day for 1 day     valACYclovir (VALTREX) 1000 MG tablet Take 1 tablet (1,000 mg total) by mouth 2 (two) times daily. 60 tablet 0    Allergies  Allergen Reactions   Hydrocodone Itching   Penicillins Other (See Comments)    unknown    Social History   Socioeconomic History   Marital status: Married     Spouse name: Systems analyst   Number of children: Not on file   Years of education: Not on file   Highest education level: Not on file  Occupational History   Not on file  Tobacco Use   Smoking status: Never   Smokeless tobacco: Never  Vaping Use   Vaping Use: Never used  Substance and Sexual Activity   Alcohol use: Not Currently    Comment: occasionally   Drug use: Yes    Types: Marijuana   Sexual activity: Yes    Partners: Male    Birth control/protection: None  Other Topics Concern   Not on file  Social History Narrative   ** Merged History Encounter **       Social Determinants of Health   Financial Resource Strain: Not on file  Food Insecurity: Not on file  Transportation Needs: Not on file  Physical Activity: Not on file  Stress: Not on file  Social Connections: Not on file  Intimate Partner Violence: Not on file    Family History  Problem Relation Age of Onset   Hypertension Mother    Dementia Maternal Grandfather    Diabetes Paternal Grandmother      ROS   Physical Exam: BP 121/62 (BP Location: Left Arm)   Pulse 94   Temp 98.8 F (37.1 C) (Oral)   Resp 17   Ht 5\' 5"  (1.651 m)   Wt 69.9 kg   LMP 05/26/2021   SpO2 99%   BMI  25.63 kg/m    Constitutional: alert, cooperative. Initially sleeping in room Cardiac: S1S2, RRR Lungs: CTAB No CVA or suprapubic tenderness  OBGyn Exam  FHT 140, mod variability, pos accels, no decels Toco: no contractions detected  Labs: UA-  Pos protein, nitrite, leukocytes, WBC, bacteria Flu/covid neg/neg    Discharge Instructions     Discharge activity:  No Restrictions   Complete by: As directed    Discharge diet:  No restrictions   Complete by: As directed    Discharge instructions   Complete by: As directed    Return for worsening symptoms, take full course of antibiotic as prescribed. Use zofran for nausea as needed.      Allergies as of 01/19/2022       Reactions   Hydrocodone Itching    Penicillins Other (See Comments)   unknown        Medication List     TAKE these medications    nitrofurantoin (macrocrystal-monohydrate) 100 MG capsule Commonly known as: Macrobid Take 1 capsule (100 mg total) by mouth 2 (two) times daily for 7 days.   ondansetron 4 MG tablet Commonly known as: Zofran Take 1 tablet (4 mg total) by mouth every 8 (eight) hours as needed for nausea or vomiting. What changed: Another medication with the same name was added. Make sure you understand how and when to take each.   ondansetron 4 MG tablet Commonly known as: Zofran Take 1 tablet (4 mg total) by mouth every 8 (eight) hours as needed for nausea or vomiting. What changed: You were already taking a medication with the same name, and this prescription was added. Make sure you understand how and when to take each.   permethrin 5 % cream Commonly known as: ELIMITE 1 application Externally once a day for 1 day   valACYclovir 1000 MG tablet Commonly known as: VALTREX Take 1 tablet (1,000 mg total) by mouth 2 (two) times daily.   Vitafol Gummies 3.33-0.333-34.8 MG Chew Chew 3 tablets by mouth daily.       Assessment: 31y/o G4P3 at 34wk0d Suspected UTI Viral illness RNST  Plan Supportive care for viral syndrome Avoid ibuprofen, can take tylenol for low grade temp and pain Take antibiotic as prescribed for likely UTI, will inform of culture results once available Return for worsening symptoms or if you do not improve  Total time spent taking care of this patient: 30 minutes  Signed: Raeford Razor CNM, FNP 01/19/2022, 2:49 PM

## 2022-01-20 ENCOUNTER — Encounter: Payer: Self-pay | Admitting: Obstetrics and Gynecology

## 2022-01-20 ENCOUNTER — Inpatient Hospital Stay: Payer: Medicaid Other | Admitting: Registered Nurse

## 2022-01-20 ENCOUNTER — Other Ambulatory Visit: Payer: Self-pay

## 2022-01-20 ENCOUNTER — Inpatient Hospital Stay
Admission: EM | Admit: 2022-01-20 | Discharge: 2022-01-23 | DRG: 796 | Disposition: A | Discharge status: Home / Self Care | Payer: Medicaid Other | Attending: Certified Nurse Midwife | Admitting: Certified Nurse Midwife

## 2022-01-20 DIAGNOSIS — Z6791 Unspecified blood type, Rh negative: Secondary | ICD-10-CM | POA: Diagnosis not present

## 2022-01-20 DIAGNOSIS — Z3A34 34 weeks gestation of pregnancy: Secondary | ICD-10-CM

## 2022-01-20 DIAGNOSIS — O99324 Drug use complicating childbirth: Secondary | ICD-10-CM | POA: Diagnosis present

## 2022-01-20 DIAGNOSIS — Z20822 Contact with and (suspected) exposure to covid-19: Secondary | ICD-10-CM | POA: Diagnosis present

## 2022-01-20 DIAGNOSIS — O26893 Other specified pregnancy related conditions, third trimester: Secondary | ICD-10-CM | POA: Diagnosis present

## 2022-01-20 DIAGNOSIS — N390 Urinary tract infection, site not specified: Secondary | ICD-10-CM | POA: Diagnosis present

## 2022-01-20 DIAGNOSIS — O9902 Anemia complicating childbirth: Secondary | ICD-10-CM

## 2022-01-20 DIAGNOSIS — Z302 Encounter for sterilization: Secondary | ICD-10-CM

## 2022-01-20 DIAGNOSIS — O0933 Supervision of pregnancy with insufficient antenatal care, third trimester: Secondary | ICD-10-CM

## 2022-01-20 DIAGNOSIS — O2343 Unspecified infection of urinary tract in pregnancy, third trimester: Secondary | ICD-10-CM

## 2022-01-20 DIAGNOSIS — F1290 Cannabis use, unspecified, uncomplicated: Secondary | ICD-10-CM | POA: Diagnosis present

## 2022-01-20 DIAGNOSIS — D649 Anemia, unspecified: Secondary | ICD-10-CM

## 2022-01-20 DIAGNOSIS — O09293 Supervision of pregnancy with other poor reproductive or obstetric history, third trimester: Secondary | ICD-10-CM

## 2022-01-20 DIAGNOSIS — O4703 False labor before 37 completed weeks of gestation, third trimester: Secondary | ICD-10-CM | POA: Diagnosis present

## 2022-01-20 DIAGNOSIS — F1190 Opioid use, unspecified, uncomplicated: Secondary | ICD-10-CM

## 2022-01-20 LAB — COMPREHENSIVE METABOLIC PANEL
ALT: 7 U/L (ref 0–44)
ALT: 9 U/L (ref 0–44)
AST: 9 U/L — ABNORMAL LOW (ref 15–41)
AST: 12 U/L — ABNORMAL LOW (ref 15–41)
Albumin: 1.7 g/dL — ABNORMAL LOW (ref 3.5–5.0)
Albumin: 2.1 g/dL — ABNORMAL LOW (ref 3.5–5.0)
Alkaline Phosphatase: 172 U/L — ABNORMAL HIGH (ref 38–126)
Alkaline Phosphatase: 217 U/L — ABNORMAL HIGH (ref 38–126)
Anion gap: 12 (ref 5–15)
Anion gap: 11 (ref 5–15)
BUN: <5 mg/dL — ABNORMAL LOW (ref 6–20)
BUN: <5 mg/dL — ABNORMAL LOW (ref 6–20)
CO2: 17 mmol/L — ABNORMAL LOW (ref 22–32)
CO2: 19 mmol/L — ABNORMAL LOW (ref 22–32)
Calcium: 7.3 mg/dL — ABNORMAL LOW (ref 8.9–10.3)
Calcium: 7.8 mg/dL — ABNORMAL LOW (ref 8.9–10.3)
Chloride: 103 mmol/L (ref 98–111)
Chloride: 103 mmol/L (ref 98–111)
Creatinine, Ser: 0.4 mg/dL — ABNORMAL LOW (ref 0.44–1.00)
Creatinine, Ser: 0.53 mg/dL (ref 0.44–1.00)
GFR, Estimated: >60 mL/min (ref >60)
GFR, Estimated: >60 mL/min (ref >60)
Glucose, Bld: 82 mg/dL (ref 70–99)
Glucose, Bld: 145 mg/dL — ABNORMAL HIGH (ref 70–99)
Potassium: 2.7 mmol/L — CL (ref 3.5–5.1)
Potassium: 3 mmol/L — ABNORMAL LOW (ref 3.5–5.1)
Sodium: 132 mmol/L — ABNORMAL LOW (ref 135–145)
Sodium: 133 mmol/L — ABNORMAL LOW (ref 135–145)
Total Bilirubin: 1.3 mg/dL — ABNORMAL HIGH (ref 0.3–1.2)
Total Bilirubin: 1.3 mg/dL — ABNORMAL HIGH (ref 0.3–1.2)
Total Protein: 4.9 g/dL — ABNORMAL LOW (ref 6.5–8.1)
Total Protein: 6.2 g/dL — ABNORMAL LOW (ref 6.5–8.1)

## 2022-01-20 LAB — URINE DRUG SCREEN, QUALITATIVE (ARMC ONLY)
Amphetamines, Ur Screen: NOT DETECTED
Barbiturates, Ur Screen: NOT DETECTED
Benzodiazepine, Ur Scrn: NOT DETECTED
Cannabinoid 50 Ng, Ur ~~LOC~~: POSITIVE — AB
Cocaine Metabolite,Ur ~~LOC~~: NOT DETECTED
MDMA (Ecstasy)Ur Screen: NOT DETECTED
Methadone Scn, Ur: NOT DETECTED
Opiate, Ur Screen: NOT DETECTED
Phencyclidine (PCP) Ur S: NOT DETECTED
Tricyclic, Ur Screen: NOT DETECTED

## 2022-01-20 LAB — PREPARE RBC (CROSSMATCH)

## 2022-01-20 LAB — CBC
HCT: 20.9 % — ABNORMAL LOW (ref 36.0–46.0)
Hemoglobin: 7.3 g/dL — ABNORMAL LOW (ref 12.0–15.0)
MCH: 30.2 pg (ref 26.0–34.0)
MCHC: 34.9 g/dL (ref 30.0–36.0)
MCV: 86.4 fL (ref 80.0–100.0)
Platelets: 179 10*3/uL (ref 150–400)
RBC: 2.42 MIL/uL — ABNORMAL LOW (ref 3.87–5.11)
RDW: 12.8 % (ref 11.5–15.5)
WBC: 13.5 10*3/uL — ABNORMAL HIGH (ref 4.0–10.5)
nRBC: 0 % (ref 0.0–0.2)

## 2022-01-20 LAB — RAPID HIV SCREEN (HIV 1/2 AB+AG)
HIV 1/2 Antibodies: NONREACTIVE
HIV-1 P24 Antigen - HIV24: NONREACTIVE

## 2022-01-20 LAB — CHLAMYDIA/NGC RT PCR (ARMC ONLY)
Chlamydia Tr: NOT DETECTED
N gonorrhoeae: NOT DETECTED

## 2022-01-20 LAB — GROUP B STREP BY PCR: Group B strep by PCR: NEGATIVE

## 2022-01-20 MED ORDER — ACETAMINOPHEN 325 MG PO TABS: 650.0000 mg | ORAL_TABLET | ORAL | Status: DC | PRN

## 2022-01-20 MED ORDER — BETAMETHASONE SOD PHOS & ACET 6 (3-3) MG/ML IJ SUSP
INTRAMUSCULAR | Status: AC
  Filled 2022-01-20: qty 5

## 2022-01-20 MED ORDER — OXYTOCIN-SODIUM CHLORIDE 30-0.9 UT/500ML-% IV SOLN: 2.5000 [IU]/h | INTRAVENOUS | Status: DC

## 2022-01-20 MED ORDER — SOD CITRATE-CITRIC ACID 500-334 MG/5ML PO SOLN: 30.0000 mL | ORAL | Status: DC | PRN

## 2022-01-20 MED ORDER — SODIUM CHLORIDE 0.9 % IV SOLN
INTRAVENOUS | Status: DC | PRN
  Administered 2022-01-20: 3 mL via EPIDURAL
  Administered 2022-01-20: 5 mL via EPIDURAL

## 2022-01-20 MED ORDER — EPHEDRINE 5 MG/ML INJ: 10.0000 mg | INTRAVENOUS | Status: DC | PRN

## 2022-01-20 MED ORDER — SODIUM CHLORIDE 0.9 % IV SOLN
1.0000 g | Freq: Once | INTRAVENOUS | Status: AC
  Administered 2022-01-20: 1 g via INTRAVENOUS
  Filled 2022-01-20: qty 10

## 2022-01-20 MED ORDER — MISOPROSTOL 200 MCG PO TABS
ORAL_TABLET | ORAL | Status: AC
  Filled 2022-01-20: qty 4

## 2022-01-20 MED ORDER — LACTATED RINGERS IV SOLN: 500.0000 mL | Freq: Once | INTRAVENOUS | Status: DC

## 2022-01-20 MED ORDER — LIDOCAINE-EPINEPHRINE (PF) 1.5 %-1:200000 IJ SOLN
INTRAMUSCULAR | Status: DC | PRN
  Administered 2022-01-20: 4 mL via EPIDURAL

## 2022-01-20 MED ORDER — BENZOCAINE-MENTHOL 20-0.5 % EX AERO
1.0000 | INHALATION_SPRAY | CUTANEOUS | Status: DC | PRN
  Filled 2022-01-20: qty 56

## 2022-01-20 MED ORDER — LACTATED RINGERS IV SOLN: INTRAVENOUS | Status: DC

## 2022-01-20 MED ORDER — LACTATED RINGERS IV SOLN: 500.0000 mL | INTRAVENOUS | Status: DC | PRN

## 2022-01-20 MED ORDER — SODIUM CHLORIDE 0.9% IV SOLUTION: Freq: Once | INTRAVENOUS | Status: DC

## 2022-01-20 MED ORDER — HYDROXYZINE HCL 25 MG PO TABS
25.0000 mg | ORAL_TABLET | Freq: Once | ORAL | Status: AC
  Administered 2022-01-20: 25 mg via ORAL
  Filled 2022-01-20: qty 1

## 2022-01-20 MED ORDER — LIDOCAINE HCL (PF) 1 % IJ SOLN: 30.0000 mL | INTRAMUSCULAR | Status: DC | PRN

## 2022-01-20 MED ORDER — ONDANSETRON HCL 4 MG/2ML IJ SOLN: 4.0000 mg | Freq: Four times a day (QID) | INTRAMUSCULAR | Status: DC | PRN

## 2022-01-20 MED ORDER — ACETAMINOPHEN 325 MG PO TABS
650.0000 mg | ORAL_TABLET | ORAL | Status: DC | PRN
  Administered 2022-01-21 – 2022-01-22 (×3): 650 mg via ORAL
  Filled 2022-01-20 (×3): qty 2

## 2022-01-20 MED ORDER — POTASSIUM CHLORIDE 10 MEQ/100ML IV SOLN
10.0000 meq | INTRAVENOUS | Status: AC
  Administered 2022-01-20 (×2): 10 meq via INTRAVENOUS
  Filled 2022-01-20 (×2): qty 100

## 2022-01-20 MED ORDER — HYDROXYZINE HCL 25 MG PO TABS: 50.0000 mg | ORAL_TABLET | Freq: Four times a day (QID) | ORAL | Status: DC | PRN

## 2022-01-20 MED ORDER — OXYTOCIN BOLUS FROM INFUSION
333.0000 mL | Freq: Once | INTRAVENOUS | Status: AC
  Administered 2022-01-20: 333 mL via INTRAVENOUS

## 2022-01-20 MED ORDER — OXYTOCIN 10 UNIT/ML IJ SOLN
INTRAMUSCULAR | Status: AC
  Filled 2022-01-20: qty 2

## 2022-01-20 MED ORDER — AMMONIA AROMATIC IN INHA
RESPIRATORY_TRACT | Status: AC
  Filled 2022-01-20: qty 10

## 2022-01-20 MED ORDER — DIPHENHYDRAMINE HCL 50 MG/ML IJ SOLN: 12.5000 mg | INTRAMUSCULAR | Status: DC | PRN

## 2022-01-20 MED ORDER — IBUPROFEN 600 MG PO TABS
600.0000 mg | ORAL_TABLET | Freq: Four times a day (QID) | ORAL | Status: DC
  Administered 2022-01-21 – 2022-01-23 (×6): 600 mg via ORAL
  Filled 2022-01-20 (×7): qty 1

## 2022-01-20 MED ORDER — POTASSIUM CHLORIDE 20 MEQ PO PACK
40.0000 meq | PACK | Freq: Once | ORAL | Status: AC
  Administered 2022-01-20: 40 meq via ORAL
  Filled 2022-01-20: qty 2

## 2022-01-20 MED ORDER — OXYTOCIN-SODIUM CHLORIDE 30-0.9 UT/500ML-% IV SOLN
INTRAVENOUS | Status: AC
  Filled 2022-01-20: qty 500

## 2022-01-20 MED ORDER — PHENYLEPHRINE 80 MCG/ML (10ML) SYRINGE FOR IV PUSH (FOR BLOOD PRESSURE SUPPORT): 80.0000 ug | PREFILLED_SYRINGE | INTRAVENOUS | Status: DC | PRN

## 2022-01-20 MED ORDER — BETAMETHASONE SOD PHOS & ACET 6 (3-3) MG/ML IJ SUSP
12.0000 mg | INTRAMUSCULAR | Status: DC
  Administered 2022-01-20: 12 mg via INTRAMUSCULAR
  Filled 2022-01-20: qty 2

## 2022-01-20 MED ORDER — TERBUTALINE SULFATE 1 MG/ML IJ SOLN
0.2500 mg | Freq: Once | INTRAMUSCULAR | Status: AC
  Administered 2022-01-20: 0.25 mg via SUBCUTANEOUS
  Filled 2022-01-20: qty 1

## 2022-01-20 MED ORDER — FENTANYL-BUPIVACAINE-NACL 0.5-0.125-0.9 MG/250ML-% EP SOLN
12.0000 mL/h | EPIDURAL | Status: DC | PRN
  Administered 2022-01-20 (×2): 12 mL/h via EPIDURAL
  Filled 2022-01-20: qty 250

## 2022-01-20 MED ORDER — LIDOCAINE HCL (PF) 1 % IJ SOLN
INTRAMUSCULAR | Status: AC
  Filled 2022-01-20: qty 30

## 2022-01-20 MED ORDER — LIDOCAINE HCL (PF) 1 % IJ SOLN
INTRAMUSCULAR | Status: DC | PRN
  Administered 2022-01-20: 3 mL via SUBCUTANEOUS

## 2022-01-20 MED ORDER — ACETAMINOPHEN 500 MG PO TABS: 1000.0000 mg | ORAL_TABLET | Freq: Four times a day (QID) | ORAL | Status: DC | PRN

## 2022-01-20 NOTE — Discharge Summary (Signed)
Postpartum Discharge Summary  Date of Service updated 01/20/2022     Patient Name: [name] DOB: 1990-06-19 MRN: [id number]  Date of admission: 01/20/2022 Delivery date:01/20/2022  Delivering provider: Imagene Riches  Date of discharge: 01/23/2022  Admitting diagnosis: Preterm uterine contractions in third trimester, antepartum [O47.03] Active preterm labor [O60.10X0] Intrauterine pregnancy: [redacted]w[redacted]d    Secondary diagnosis:  Active Problems:   Active preterm labor   Postpartum care following vaginal delivery  Additional problems: severe anemia; insufficient prenatal care, Hx HSV, active marijuan use, on subutex, UTI    Discharge diagnosis: Preterm Pregnancy Delivered                                              Post partum procedures: bilateral Tubal ligation  Augmentation: N/A Complications: None  Hospital course: Onset of Labor With Vaginal Delivery      31y.o. yo [id number] admitted in Latent Labor on 01/20/2022. Labor course was complicated by a critically low Potassium level and severe anemia, requiring a blood transfusion and IV Potassium intrapartally. The patient was positive for marijuana on admission. Prior to delivery, while under Observation, she was treated with IV fluids, Terbutaline, and rocephin for a UTI. She received one dose of Betamethazone the day of delivery. Membrane Rupture Time/Date: 9:12 PM ,01/20/2022   Delivery Method:Vaginal, Spontaneous  Episiotomy: None  Lacerations:  None  Patient had a postpartum course complicated by NA. She had BTL.  She is ambulating, tolerating a regular diet, passing flatus, and urinating well. Has had some gas pain and pain at the incision but managing with PRN medications. BP elevated, pt admits to being under a lot of stress. Is open to medication if needed. Pt requesting medication for depression, has dealt with depression in the past was treated with Zoloft with good results. Pt states she has been  really tearful and under a lot of stress with an infant in NICU and her family sick at home.  Pt plans to breastfeed but has not yet pumped.  Will be getting a pump through insurance, has a hand pump at home. Her mother and MIL are available to help around the house. Patient is discharged home in stable condition on 01/23/22.  Newborn Data: Birth date:01/20/2022  Birth time:9:12 PM  Gender:Female  Living status:Living  Apgars:8 ,9  Weight:2030 g   Magnesium Sulfate received: No BMZ received: Yes Rhophylac:No MMR:No T-DaP:Given prenatally Flu: No Transfusion:Yes  Physical exam  Vitals:   01/22/22 1724 01/22/22 1950 01/23/22 0038 01/23/22 0846  BP: (!) 153/89 (!) 156/89 (!) 155/82 138/73  Pulse: (!) 48 (!) 47 (!) 51 (!) 51  Resp:  _0 Temp:  98.1 F (36.7 C) 97.9 F (36.6 C) 97.8 F (36.6 C)  TempSrc:  Oral Oral Oral  SpO2: 99% 99% 100% 100%  Weight:      Height:       General: alert Breasts: soft, no redness or masses. Nipples erect and intact bilaterally  Lochia: appropriate Uterine Fundus: firm Incision: Healing well with no significant drainage Perineum: min swelling  Extremities: no edema DVT Evaluation: No evidence of DVT seen on physical exam. Labs: Lab Results  Component Value Date   WBC 16.7 (H) 01/21/2022   HGB 9.0 (L) 01/21/2022   HCT 25.4 (L) 01/21/2022   MCV 83.6 01/21/2022  PLT 244 01/21/2022      Latest Ref Rng & Units 01/20/2022    8:30 PM  CMP  Glucose 70 - 99 mg/dL 145   BUN 6 - 20 mg/dL <5   Creatinine 0.44 - 1.00 mg/dL 0.53   Sodium 135 - 145 mmol/L 133   Potassium 3.5 - 5.1 mmol/L 3.0   Chloride 98 - 111 mmol/L 103   CO2 22 - 32 mmol/L 19   Calcium 8.9 - 10.3 mg/dL 7.8   Total Protein 6.5 - 8.1 g/dL 6.2   Total Bilirubin 0.3 - 1.2 mg/dL 1.3   Alkaline Phos 38 - 126 U/L 217   AST 15 - 41 U/L 12   ALT 0 - 44 U/L 9    Edinburgh Score:    01/21/2022    1:04 PM  Edinburgh Postnatal Depression Scale Screening Tool  I have been  able to laugh and see the funny side of things. 0  I have looked forward with enjoyment to things. 0  I have blamed myself unnecessarily when things went wrong. 1  I have been anxious or worried for no good reason. 0  I have felt scared or panicky for no good reason. 0  Things have been getting on top of me. 0  I have been so unhappy that I have had difficulty sleeping. 0  I have felt sad or miserable. 0  I have been so unhappy that I have been crying. 0  The thought of harming myself has occurred to me. 0  Edinburgh Postnatal Depression Scale Total 1      After visit meds:  Allergies as of 01/23/2022       Reactions   Hydrocodone Itching   Penicillins Rash        Medication List     STOP taking these medications    ondansetron 4 MG tablet Commonly known as: Zofran   permethrin 5 % cream Commonly known as: ELIMITE       TAKE these medications    acetaminophen 325 MG tablet Commonly known as: Tylenol Take 2 tablets (650 mg total) by mouth every 4 (four) hours as needed (for pain scale < 4).   docusate sodium 100 MG capsule Commonly known as: COLACE Take 1 capsule (100 mg total) by mouth 2 (two) times daily as needed for mild constipation.   ferrous sulfate 325 (65 FE) MG tablet Commonly known as: FerrouSul Take 1 tablet (325 mg total) by mouth 2 (two) times daily.   ibuprofen 600 MG tablet Commonly known as: ADVIL Take 1 tablet (600 mg total) by mouth every 6 (six) hours as needed.   labetalol 100 MG tablet Commonly known as: NORMODYNE Take 1 tablet (100 mg total) by mouth 2 (two) times daily.   nitrofurantoin (macrocrystal-monohydrate) 100 MG capsule Commonly known as: Macrobid Take 1 capsule (100 mg total) by mouth 2 (two) times daily for 7 days.   oxyCODONE 5 MG immediate release tablet Commonly known as: Oxy IR/ROXICODONE Take 1-2 tablets (5-10 mg total) by mouth every 4 (four) hours as needed for moderate pain or severe pain.   sertraline 50 MG  tablet Commonly known as: Zoloft Take 1 tablet (50 mg total) by mouth daily. Start by taking 67m (half a tablet) times 7 days then increase to 522m(1 tablet) daily   simethicone 80 MG chewable tablet Commonly known as: MYLICON Chew 1 tablet (80 mg total) by mouth as needed for flatulence.   valACYclovir 1000 MG tablet Commonly  known as: VALTREX Take 1 tablet (1,000 mg total) by mouth 2 (two) times daily.   Vitafol Gummies 3.33-0.333-34.8 MG Chew Chew 3 tablets by mouth daily.               Discharge Care Instructions  (From admission, onward)           Start     Ordered   01/23/22 0000  Discharge wound care:       Comments: SHOWER DAILY Wash incision gently with soap and water.  Call office with any drainage, redness, or firmness of the incision.   01/23/22 1018             Discharge home in stable condition Infant Feeding: Bottle and Breast Infant Disposition:NICU Discharge instruction: per After Visit Summary and Postpartum booklet. Start Zoloft 54m daily x 7 days then 530mdaily Labetalol 10074mID, check BP twice a day   Iron supplement twice a day  Activity: Advance as tolerated. Pelvic rest for 6 weeks.  Diet: routine diet Anticipated Birth Control: BTL done PP Postpartum Appointment:return on Monday or Tuesday for BP check. 6 weeks  Additional Postpartum F/U: Postpartum Depression checkup Future Appointments: Future Appointments  Date Time Provider DepRidgecrest1/20/2023  9:45 AM AOB-NURSE AOB-AOB None  02/03/2022  4:00 PM CheRubie MaidD AOB-AOB None   Follow up Visit:  Follow-up Information     CheRubie MaidD. Schedule an appointment as soon as possible for a visit in 2 week(s).   Specialties: Obstetrics and Gynecology, Radiology Why: Please call the office to schedule 6 weeks past your delivery date for a physical. Contact information: [address] [phone]         CheRubie MaidD  Follow up on 02/03/2022.   Specialties: Obstetrics and Gynecology, Radiology Why: Post-op follow up on 11/28 @ 4 pm Contact information: [address] on 01/26/2022.   Why: Blood pressure check on 11/20 @ 9:15 am Contact information: [phone]                 01/23/2022 [email or web address]

## 2022-01-20 NOTE — Anesthesia Procedure Notes (Signed)
Epidural Patient location during procedure: OB End time: 01/20/2022 9:11 PM  Staffing Performed: anesthesiologist   Preanesthetic Checklist Completed: patient identified, IV checked, site marked, risks and benefits discussed, surgical consent, monitors and equipment checked, pre-op evaluation and timeout performed  Epidural Patient position: sitting Prep: Betadine Patient monitoring: heart rate, continuous pulse ox and blood pressure Approach: midline Location: L4-L5 Injection technique: LOR saline  Needle:  Needle type: Tuohy  Needle gauge: 18 G Needle length: 9 cm and 9 Needle insertion depth: 6 cm Catheter type: closed end flexible Catheter size: 20 Guage Catheter at skin depth: 11 cm Test dose: negative and 1.5% lidocaine with Epi 1:200 K  Assessment Events: blood not aspirated, injection not painful, no injection resistance, no paresthesia and negative IV test  Additional Notes   Patient tolerated the insertion well without complications.Reason for block:procedure for pain

## 2022-01-20 NOTE — Progress Notes (Signed)
Patient ID: [name], female   DOB: 06/07/1990, 31 y.o.   MRN: [id number] Discussed admission with CNM Eunice Blase . Appears to be moving cervix without significant ctx . + drug screen . Recent UTI . She has received Betamethasone . Significant Hypokalemia , hyponatremia and anemia  No recent HSV outbreaks   Plans : admit expectant management for labor  Given extreme anemia start 1 unit blood transfusion . K+ replacement po + IV . LR IVF and repeat CMP

## 2022-01-20 NOTE — H&P (Signed)
[name] is a 31 y.o. female admitted for initially some irregular contractions at 34 weeks 1 day. She was seen in triage yesterday and diagnosed with a UTI. She returned earlier this afternoon with c/o onset of regular contractions post intercourse. Her pregnancy is marked by: insufficient care, ongoing use of MJ; Subutex use; hx of HSV,  and an elevated 1hr GTT with no follow up testing.She arrived to the unit c/o contractions and was given IV bolus, EFM, and Terbutaline sq for one dose. She received a dose of  Betamethasone IM for fetal lung maturity. She was then rechecked and found to have made cervical change. Now admitted for labor. OB History     Gravida  4   Para  3   Term  3   Preterm  0   AB  0   Living  3      SAB  0   IAB  0   Ectopic  0   Multiple  0   Live Births  3          Past Medical History:  Diagnosis Date   Depression    HSV (herpes simplex virus) infection    Past Surgical History:  Procedure Laterality Date   FACIAL RECONSTRUCTION SURGERY  2014   Family History: family history includes Dementia in her maternal grandfather; Diabetes in her paternal grandmother; Hypertension in her mother. Social History:  reports that she has never smoked. She has never used smokeless tobacco. She reports that she does not currently use alcohol. She reports current drug use. Drug: Marijuana.     Maternal Diabetes: No She had an elevated 1hr GTT, but never completed a three hour glucose screen. Genetic Screening: Normal Maternal Ultrasounds/Referrals: Normal Fetal Ultrasounds or other Referrals:  None Maternal Substance Abuse:  Yes:  Type: Marijuana, Other:  Significant Maternal Medications:  None Significant Maternal Lab Results:  Group B Strep negative Number of Prenatal Visits:greater than 3 verified prenatal visits RH negative Other Comments:   Rapid GBS today is negative.   Review of Systems  Constitutional:  Positive for fatigue.  HENT:  Negative.    Eyes: Negative.   Respiratory: Negative.    Cardiovascular: Negative.   Gastrointestinal:  Positive for abdominal pain.  Endocrine: Negative.   Genitourinary: Negative.   Musculoskeletal: Negative.   Allergic/Immunologic: Negative.   Neurological: Negative.   Hematological: Negative.   Psychiatric/Behavioral: Negative.     History Dilation: 4 Effacement (%): 100 Station: -3 Exam by:: Gigi Gin CNM Blood pressure 127/64, pulse 96, temperature 98.2 F (36.8 C), temperature source Oral, height 5\' 5"  (1.651 m), weight 69.9 kg, last menstrual period 05/26/2021, not currently breastfeeding. Maternal Exam:  Introitus: Normal vulva.   Physical Exam Constitutional:      Appearance: Normal appearance. She is normal weight.  HENT:     Head: Normocephalic and atraumatic.  Cardiovascular:     Rate and Rhythm: Normal rate and regular rhythm.     Pulses: Normal pulses.     Heart sounds: Normal heart sounds.  Pulmonary:     Effort: Pulmonary effort is normal.     Breath sounds: Normal breath sounds.  Abdominal:     Comments: Gravid abdomen.   Genitourinary:    General: Normal vulva.     Rectum: Normal.     Comments: Labia and perineum without any visible lesions. SVE is 4cms/100% effaced/-3 station. FHTS are reactive and Category 1. Musculoskeletal:        General: Normal range  of motion.     Cervical back: Normal range of motion and neck supple.  Skin:    General: Skin is warm and dry.  Neurological:     General: No focal deficit present.     Mental Status: She is oriented to person, place, and time.  Psychiatric:     Comments: Demonstrating anxiety and less pain tolerance     Prenatal labs: ABO, Rh: --/--/PENDING (11/14 1620) Antibody: PENDING (11/14 1620) Rubella: 3.09 (06/09 1032) RPR: Non Reactive (10/04 1112)  HBsAg: Negative (06/09 1032)  HIV: NON REACTIVE (11/14 1622)  GBS: NEGATIVE/-- (11/14 1554)  Results for orders placed or performed  during the hospital encounter of 01/20/22 (from the past 24 hour(s))  Group B strep by PCR     Status: None   Collection Time: 01/20/22  3:54 PM   Specimen: Vaginal/Rectal; Genital  Result Value Ref Range   Group B strep by PCR NEGATIVE NEGATIVE  Type and screen [place]     Status: None   Collection Time: 01/20/22  4:20 PM  Result Value Ref Range   ABO/RH(D) O NEG    Antibody Screen POS    Sample Expiration      01/23/2022,2359 Performed at [facility], [address] [place], [place] [address]   Comprehensive metabolic panel     Status: Abnormal   Collection Time: 01/20/22  4:20 PM  Result Value Ref Range   Sodium 132 (L) 135 - 145 mmol/L   Potassium 2.7 (LL) 3.5 - 5.1 mmol/L   Chloride 103 98 - 111 mmol/L   CO2 17 (L) 22 - 32 mmol/L   Glucose, Bld 82 70 - 99 mg/dL   BUN <5 (L) 6 - 20 mg/dL   Creatinine, Ser 0.40 (L) 0.44 - 1.00 mg/dL   Calcium 7.3 (L) 8.9 - 10.3 mg/dL   Total Protein 4.9 (L) 6.5 - 8.1 g/dL   Albumin 1.7 (L) 3.5 - 5.0 g/dL   AST 9 (L) 15 - 41 U/L   ALT 7 0 - 44 U/L   Alkaline Phosphatase 172 (H) 38 - 126 U/L   Total Bilirubin 1.3 (H) 0.3 - 1.2 mg/dL   GFR, Estimated >60 >60 mL/min   Anion gap 12 5 - 15  CBC     Status: Abnormal   Collection Time: 01/20/22  4:20 PM  Result Value Ref Range   WBC 13.5 (H) 4.0 - 10.5 K/uL   RBC 2.42 (L) 3.87 - 5.11 MIL/uL   Hemoglobin 7.3 (L) 12.0 - 15.0 g/dL   HCT 20.9 (L) 36.0 - 46.0 %   MCV 86.4 80.0 - 100.0 fL   MCH 30.2 26.0 - 34.0 pg   MCHC 34.9 30.0 - 36.0 g/dL   RDW 12.8 11.5 - 15.5 %   Platelets 179 150 - 400 K/uL   nRBC 0.0 0.0 - 0.2 %  Rapid HIV screen (HIV 1/2 Ab+Ag)     Status: None   Collection Time: 01/20/22  4:22 PM  Result Value Ref Range   HIV-1 P24 Antigen - HIV24 NON REACTIVE NON REACTIVE   HIV 1/2 Antibodies NON REACTIVE NON REACTIVE   Interpretation (HIV Ag Ab)      A non reactive test result means that HIV 1 or HIV 2 antibodies and HIV 1 p24 antigen were not  detected in the specimen.  Urine Drug Screen, Qualitative ([place] only)     Status: Abnormal   Collection Time: 01/20/22  4:25 PM  Result  Value Ref Range   Tricyclic, Ur Screen NONE DETECTED NONE DETECTED   Amphetamines, Ur Screen NONE DETECTED NONE DETECTED   MDMA (Ecstasy)Ur Screen NONE DETECTED NONE DETECTED   Cocaine Metabolite,Ur [place] NONE DETECTED NONE DETECTED   Opiate, Ur Screen NONE DETECTED NONE DETECTED   Phencyclidine (PCP) Ur S NONE DETECTED NONE DETECTED   Cannabinoid 50 Ng, Ur [place] POSITIVE (A) NONE DETECTED   Barbiturates, Ur Screen NONE DETECTED NONE DETECTED   Benzodiazepine, Ur Scrn NONE DETECTED NONE DETECTED   Methadone Scn, Ur NONE DETECTED NONE DETECTED  Prepare RBC (crossmatch)     Status: None (Preliminary result)   Collection Time: 01/20/22  5:52 PM  Result Value Ref Range   Order Confirmation PENDING     Assessment/Plan: IUP 34 weeks 1 day gestation. Preterm labor-  GBS negative. HIV, RPR, GC pending Anemia with low H and H- (7.3 and 20) Low Potassium (2.7) + for Marijuana and her Subutex  Admitted for labor Will transfuse one unit of RBCs Routine admission orders. Admission labs previously drawn PO Potassium given, Will add IV potassium Large bore IV added. Anesthesia contacted for epidural  Plan discussed with Dr. Feliberto Gottron.  Mirna Mires 01/20/2022, 5:54 PM

## 2022-01-20 NOTE — Anesthesia Preprocedure Evaluation (Addendum)
Anesthesia Evaluation  Patient identified by MRN, date of birth, ID band Patient awake    Reviewed: Allergy & Precautions, H&P , NPO status , Patient's Chart, lab work & pertinent test results, reviewed documented beta blocker date and time   Airway Mallampati: II  TM Distance: >3 FB Neck ROM: full    Dental no notable dental hx. (+) Teeth Intact   Pulmonary neg pulmonary ROS   Pulmonary exam normal breath sounds clear to auscultation       Cardiovascular Exercise Tolerance: Good negative cardio ROS  Rhythm:regular Rate:Normal     Neuro/Psych  PSYCHIATRIC DISORDERS  Depression    negative neurological ROS     GI/Hepatic negative GI ROS, Neg liver ROS,,,  Endo/Other  negative endocrine ROSdiabetes, Gestational    Renal/GU negative Renal ROS  negative genitourinary   Musculoskeletal   Abdominal   Peds  Hematology negative hematology ROS (+)   Anesthesia Other Findings   Reproductive/Obstetrics (+) Pregnancy                             Anesthesia Physical Anesthesia Plan  ASA: 2  Anesthesia Plan: Epidural   Post-op Pain Management:    Induction:   PONV Risk Score and Plan:   Airway Management Planned:   Additional Equipment:   Intra-op Plan:   Post-operative Plan:   Informed Consent: I have reviewed the patients History and Physical, chart, labs and discussed the procedure including the risks, benefits and alternatives for the proposed anesthesia with the patient or authorized representative who has indicated his/her understanding and acceptance.       Plan Discussed with:   Anesthesia Plan Comments: (Waiting for conformation after multiple irregularities on chemistry.  Pt with recheck for K, showing severe hyponatremia requiring recheck for verification prior to proceeding. ja)       Anesthesia Quick Evaluation

## 2022-01-20 NOTE — OB Triage Note (Addendum)
Pt is a 31yo G4P3, 34w 1d. She arrived to the unit with complaints of contractions every 2-3 minutes starting at 1300 today. She denies vaginal bleeding/discharge and reports positive fetal movement.   VS stable, monitors applied and assessing.   Initial FHT 155 at 1437.

## 2022-01-20 NOTE — Progress Notes (Signed)
[name] is a 31 y.o. G4P3003 at [redacted]w[redacted]d by ultrasound admitted for Preterm labor. Due to her low H and H, she is receiving a unit of RBCs. She is also receiving IV potassium after an initial oral dose of .   Subjective:she is very uncomfortable with her contractions, although they are less frequent, approximately q 10 minutes. She is asking for an epidural. The FOB is now present and supportive at the bedside.   Objective: BP 106/81   Pulse (!) 107   Temp 97.9 F (36.6 C) (Oral)   Resp 18   Ht 5\' 5"  (1.651 m)   Wt 69.9 kg   LMP 05/26/2021   SpO2 99%   BMI 25.63 kg/m  No intake/output data recorded. No intake/output data recorded.  FHT:  FHR: 135 bpm, variability: moderate,  accelerations:  Present,  decelerations:  Absent UC:   regular, every 10 minutes SVE:   Dilation: 4 Effacement (%): 100 Station: -3 Exam by:: [email or web address] CNM  Labs: Lab Results  Component Value Date   WBC 13.5 (H) 01/20/2022   HGB 7.3 (L) 01/20/2022   HCT 20.9 (L) 01/20/2022   MCV 86.4 01/20/2022   PLT 179 01/20/2022    Assessment / Plan: Preterm labor, now contracting less frequently. NICU has been notified of admission and gestational age.  Labor:    In preterm labor, but not yet active.  Fetal Wellbeing:  Category I Pain Control:   she desires Epidural I/D:  n/a Anticipated MOD:  NSVD  Starting blood infusion of one unit RBCs. IV pitassium continues (20 mEQ). Dr. 01/22/2022, anesthesia, contacted  for epidural. Will redraw CMP prior to recheck her Potassium level prior to epidural placement. Will recheck once patient is more comfortable.  Pernell Dupre, CNM  01/20/2022 7:25 PM       01/22/2022, CNM 01/20/2022, 7:06 PM

## 2022-01-21 LAB — CBC WITH DIFFERENTIAL/PLATELET
Abs Immature Granulocytes: 0.19 10*3/uL — ABNORMAL HIGH (ref 0.00–0.07)
Basophils Absolute: 0.1 10*3/uL (ref 0.0–0.1)
Basophils Relative: 0 %
Eosinophils Absolute: 0 10*3/uL (ref 0.0–0.5)
Eosinophils Relative: 0 %
HCT: 24.7 % — ABNORMAL LOW (ref 36.0–46.0)
Hemoglobin: 8.8 g/dL — ABNORMAL LOW (ref 12.0–15.0)
Immature Granulocytes: 1 %
Lymphocytes Relative: 5 %
Lymphs Abs: 0.9 10*3/uL (ref 0.7–4.0)
MCH: 29.8 pg (ref 26.0–34.0)
MCHC: 35.6 g/dL (ref 30.0–36.0)
MCV: 83.7 fL (ref 80.0–100.0)
Monocytes Absolute: 0.4 10*3/uL (ref 0.1–1.0)
Monocytes Relative: 2 %
Neutro Abs: 16.2 10*3/uL — ABNORMAL HIGH (ref 1.7–7.7)
Neutrophils Relative %: 92 %
Platelets: 233 10*3/uL (ref 150–400)
RBC: 2.95 MIL/uL — ABNORMAL LOW (ref 3.87–5.11)
RDW: 13.6 % (ref 11.5–15.5)
WBC: 17.8 10*3/uL — ABNORMAL HIGH (ref 4.0–10.5)
nRBC: 0 % (ref 0.0–0.2)

## 2022-01-21 LAB — TYPE AND SCREEN
ABO/RH(D): O NEG
Antibody Screen: POSITIVE
Unit division: 0

## 2022-01-21 LAB — FETAL SCREEN: Fetal Screen: NEGATIVE

## 2022-01-21 LAB — BPAM RBC
Blood Product Expiration Date: 202311162359
ISSUE DATE / TIME: 202311141835
Unit Type and Rh: 9500

## 2022-01-21 LAB — CBC
HCT: 25.4 % — ABNORMAL LOW (ref 36.0–46.0)
Hemoglobin: 9 g/dL — ABNORMAL LOW (ref 12.0–15.0)
MCH: 29.6 pg (ref 26.0–34.0)
MCHC: 35.4 g/dL (ref 30.0–36.0)
MCV: 83.6 fL (ref 80.0–100.0)
Platelets: 244 10*3/uL (ref 150–400)
RBC: 3.04 MIL/uL — ABNORMAL LOW (ref 3.87–5.11)
RDW: 13.8 % (ref 11.5–15.5)
WBC: 16.7 10*3/uL — ABNORMAL HIGH (ref 4.0–10.5)
nRBC: 0 % (ref 0.0–0.2)

## 2022-01-21 LAB — URINE CULTURE: Culture: >=100000 — AB

## 2022-01-21 LAB — RPR: RPR Ser Ql: NONREACTIVE

## 2022-01-21 MED ORDER — COCONUT OIL OIL: 1.0000 | TOPICAL_OIL | Status: DC | PRN

## 2022-01-21 MED ORDER — SIMETHICONE 80 MG PO CHEW
80.0000 mg | CHEWABLE_TABLET | ORAL | Status: DC | PRN
  Administered 2022-01-22: 80 mg via ORAL
  Filled 2022-01-21: qty 1

## 2022-01-21 MED ORDER — DIPHENHYDRAMINE HCL 25 MG PO CAPS: 25.0000 mg | ORAL_CAPSULE | Freq: Four times a day (QID) | ORAL | Status: DC | PRN

## 2022-01-21 MED ORDER — DOCUSATE SODIUM 100 MG PO CAPS
100.0000 mg | ORAL_CAPSULE | Freq: Two times a day (BID) | ORAL | Status: DC
  Administered 2022-01-21 – 2022-01-23 (×4): 100 mg via ORAL
  Filled 2022-01-21 (×4): qty 1

## 2022-01-21 MED ORDER — NITROFURANTOIN MONOHYD MACRO 100 MG PO CAPS
100.0000 mg | ORAL_CAPSULE | Freq: Two times a day (BID) | ORAL | Status: DC
  Administered 2022-01-21 – 2022-01-23 (×4): 100 mg via ORAL
  Filled 2022-01-21 (×4): qty 1

## 2022-01-21 MED ORDER — OXYCODONE HCL 5 MG PO TABS
5.0000 mg | ORAL_TABLET | ORAL | Status: DC | PRN
  Administered 2022-01-21 – 2022-01-23 (×7): 10 mg via ORAL
  Filled 2022-01-21 (×7): qty 2

## 2022-01-21 MED ORDER — RHO D IMMUNE GLOBULIN 1500 UNIT/2ML IJ SOSY
300.0000 ug | PREFILLED_SYRINGE | Freq: Once | INTRAMUSCULAR | Status: AC
  Administered 2022-01-21: 300 ug via INTRAVENOUS
  Filled 2022-01-21: qty 2

## 2022-01-21 MED ORDER — TETANUS-DIPHTH-ACELL PERTUSSIS 5-2.5-18.5 LF-MCG/0.5 IM SUSY: 0.5000 mL | PREFILLED_SYRINGE | Freq: Once | INTRAMUSCULAR | Status: DC

## 2022-01-21 MED ORDER — ONDANSETRON HCL 4 MG/2ML IJ SOLN: 4.0000 mg | INTRAMUSCULAR | Status: DC | PRN

## 2022-01-21 MED ORDER — ONDANSETRON HCL 4 MG PO TABS: 4.0000 mg | ORAL_TABLET | ORAL | Status: DC | PRN

## 2022-01-21 MED ORDER — PRENATAL MULTIVITAMIN CH
1.0000 | ORAL_TABLET | Freq: Every day | ORAL | Status: DC
  Administered 2022-01-21 – 2022-01-22 (×2): 1 via ORAL
  Filled 2022-01-21 (×2): qty 1

## 2022-01-21 MED ORDER — WITCH HAZEL-GLYCERIN EX PADS: 1.0000 | MEDICATED_PAD | CUTANEOUS | Status: DC | PRN

## 2022-01-21 MED ORDER — ZOLPIDEM TARTRATE 5 MG PO TABS
5.0000 mg | ORAL_TABLET | Freq: Every evening | ORAL | Status: DC | PRN
  Administered 2022-01-21 – 2022-01-23 (×3): 5 mg via ORAL
  Filled 2022-01-21 (×3): qty 1

## 2022-01-21 MED ORDER — DIBUCAINE (PERIANAL) 1 % EX OINT: 1.0000 | TOPICAL_OINTMENT | CUTANEOUS | Status: DC | PRN

## 2022-01-21 NOTE — Anesthesia Postprocedure Evaluation (Signed)
Anesthesia Post Note  Patient: [name]  Procedure(s) Performed: AN AD HOC LABOR EPIDURAL  Patient location during evaluation: Mother Baby Anesthesia Type: Epidural Level of consciousness: awake and alert Pain management: pain level controlled Vital Signs Assessment: post-procedure vital signs reviewed and stable Respiratory status: spontaneous breathing, nonlabored ventilation and respiratory function stable Cardiovascular status: stable Postop Assessment: no headache, no backache and epidural receding Anesthetic complications: no   No notable events documented.   Last Vitals:  Vitals:   01/21/22 0205 01/21/22 0421  BP: 121/75 (!) 140/93  Pulse: 75 (!) 49  Resp: 18 18  Temp: (!) 36.4 C (!) 36.3 C  SpO2: 100% 100%    Last Pain:  Vitals:   01/21/22 0421  TempSrc: Oral  PainSc:                  Rosanne Gutting
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
## 2022-01-21 NOTE — Progress Notes (Signed)
Progress Note - Vaginal Delivery  [name] is a 31 y.o. [phone] now PP day 1 s/p Vaginal, Spontaneous .   Subjective:  The patient reports no complaints, up ad lib, voiding, and tolerating PO PT would like to have postpartum BTL.   Objective:  Vital signs in last 24 hours: Temp:  [97 F (36.1 C)-98.3 F (36.8 C)] 97.5 F (36.4 C) (11/15 0843) Pulse Rate:  [49-107] 54 (11/15 0843) Resp:  [18-20] 18 (11/15 0843) BP: (95-140)/(54-93) 119/75 (11/15 0843) SpO2:  [97 %-100 %] 100 % (11/15 0843) Weight:  [69.9 kg] 69.9 kg (11/14 1447)  Physical Exam:  General: alert, cooperative, appears stated age, and no distress Lochia: appropriate Uterine Fundus: firm    Data Review Recent Labs    01/21/22 0209 01/21/22 0526  HGB 8.8* 9.0*  HCT 24.7* 25.4*    Assessment/Plan: Active Problems:   Active preterm labor   Postpartum care following vaginal delivery   Social service consult ordered due to pt history  Plan for discharge tomorrow Dr.Evans /Dr.Cherry notified of pt desire to have BTL.  -- Continue routine PP care.     Doreene Burke, CNM  01/21/2022 10:35 AM

## 2022-01-22 ENCOUNTER — Inpatient Hospital Stay: Payer: Medicaid Other | Admitting: Anesthesiology

## 2022-01-22 ENCOUNTER — Encounter
Admission: EM | Disposition: A | Discharge status: Home / Self Care | Payer: Self-pay | Source: Home / Self Care | Attending: Certified Nurse Midwife

## 2022-01-22 ENCOUNTER — Other Ambulatory Visit: Payer: Self-pay

## 2022-01-22 ENCOUNTER — Encounter: Payer: Self-pay | Admitting: Obstetrics

## 2022-01-22 DIAGNOSIS — Z302 Encounter for sterilization: Secondary | ICD-10-CM

## 2022-01-22 HISTORY — PX: TUBAL LIGATION: SHX77

## 2022-01-22 LAB — RHOGAM INJECTION: Unit division: 0

## 2022-01-22 LAB — SURGICAL PATHOLOGY

## 2022-01-22 SURGERY — LIGATION, FALLOPIAN TUBE, POSTPARTUM
Anesthesia: General | Laterality: Bilateral

## 2022-01-22 MED ORDER — MIDAZOLAM HCL 2 MG/2ML IJ SOLN
INTRAMUSCULAR | Status: DC | PRN
  Administered 2022-01-22: 2 mg via INTRAVENOUS

## 2022-01-22 MED ORDER — FENTANYL CITRATE (PF) 100 MCG/2ML IJ SOLN
INTRAMUSCULAR | Status: AC
  Filled 2022-01-22: qty 2

## 2022-01-22 MED ORDER — ONDANSETRON HCL 4 MG/2ML IJ SOLN
INTRAMUSCULAR | Status: AC
  Filled 2022-01-22: qty 2

## 2022-01-22 MED ORDER — LACTATED RINGERS IV SOLN: INTRAVENOUS | Status: DC

## 2022-01-22 MED ORDER — METOCLOPRAMIDE HCL 10 MG PO TABS
10.0000 mg | ORAL_TABLET | Freq: Once | ORAL | Status: AC
  Administered 2022-01-22: 10 mg via ORAL
  Filled 2022-01-22: qty 1

## 2022-01-22 MED ORDER — FENTANYL CITRATE (PF) 100 MCG/2ML IJ SOLN
INTRAMUSCULAR | Status: AC
  Administered 2022-01-22: 25 ug via INTRAVENOUS
  Filled 2022-01-22: qty 2

## 2022-01-22 MED ORDER — PROPOFOL 10 MG/ML IV BOLUS
INTRAVENOUS | Status: AC
  Filled 2022-01-22: qty 20

## 2022-01-22 MED ORDER — FENTANYL CITRATE (PF) 100 MCG/2ML IJ SOLN
25.0000 ug | INTRAMUSCULAR | Status: DC | PRN
  Administered 2022-01-22 (×3): 25 ug via INTRAVENOUS

## 2022-01-22 MED ORDER — PROPOFOL 10 MG/ML IV BOLUS
INTRAVENOUS | Status: DC | PRN
  Administered 2022-01-22: 200 mg via INTRAVENOUS

## 2022-01-22 MED ORDER — FAMOTIDINE 20 MG PO TABS
40.0000 mg | ORAL_TABLET | Freq: Once | ORAL | Status: AC
  Administered 2022-01-22: 40 mg via ORAL
  Filled 2022-01-22: qty 2

## 2022-01-22 MED ORDER — ROCURONIUM BROMIDE 100 MG/10ML IV SOLN
INTRAVENOUS | Status: DC | PRN
  Administered 2022-01-22: 30 mg via INTRAVENOUS
  Administered 2022-01-22: 20 mg via INTRAVENOUS

## 2022-01-22 MED ORDER — BUPIVACAINE HCL 0.5 % IJ SOLN
INTRAMUSCULAR | Status: DC | PRN
  Administered 2022-01-22: 20 mL

## 2022-01-22 MED ORDER — ONDANSETRON HCL 4 MG/2ML IJ SOLN
INTRAMUSCULAR | Status: DC | PRN
  Administered 2022-01-22 (×2): 4 mg via INTRAVENOUS

## 2022-01-22 MED ORDER — KETOROLAC TROMETHAMINE 30 MG/ML IJ SOLN
INTRAMUSCULAR | Status: AC
  Filled 2022-01-22: qty 1

## 2022-01-22 MED ORDER — KETOROLAC TROMETHAMINE 30 MG/ML IJ SOLN
30.0000 mg | Freq: Once | INTRAMUSCULAR | Status: AC
  Administered 2022-01-22: 30 mg via INTRAVENOUS
  Filled 2022-01-22: qty 1

## 2022-01-22 MED ORDER — LIDOCAINE HCL (CARDIAC) PF 100 MG/5ML IV SOSY
PREFILLED_SYRINGE | INTRAVENOUS | Status: DC | PRN
  Administered 2022-01-22: 100 mg via INTRAVENOUS

## 2022-01-22 MED ORDER — BUPIVACAINE HCL (PF) 0.5 % IJ SOLN
INTRAMUSCULAR | Status: AC
  Filled 2022-01-22: qty 30

## 2022-01-22 MED ORDER — FENTANYL CITRATE (PF) 100 MCG/2ML IJ SOLN
INTRAMUSCULAR | Status: DC | PRN
  Administered 2022-01-22: 100 ug via INTRAVENOUS
  Administered 2022-01-22: 50 ug via INTRAVENOUS

## 2022-01-22 MED ORDER — GLYCOPYRROLATE 0.2 MG/ML IJ SOLN
INTRAMUSCULAR | Status: DC | PRN
  Administered 2022-01-22: .2 mg via INTRAVENOUS

## 2022-01-22 MED ORDER — ONDANSETRON HCL 4 MG/2ML IJ SOLN: 4.0000 mg | Freq: Once | INTRAMUSCULAR | Status: DC | PRN

## 2022-01-22 MED ORDER — ROCURONIUM BROMIDE 10 MG/ML (PF) SYRINGE
PREFILLED_SYRINGE | INTRAVENOUS | Status: AC
  Filled 2022-01-22: qty 10

## 2022-01-22 MED ORDER — ACETAMINOPHEN 10 MG/ML IV SOLN
INTRAVENOUS | Status: AC
  Filled 2022-01-22: qty 100

## 2022-01-22 MED ORDER — DEXMEDETOMIDINE HCL IN NACL 200 MCG/50ML IV SOLN
INTRAVENOUS | Status: DC | PRN
  Administered 2022-01-22: 8 ug via INTRAVENOUS

## 2022-01-22 MED ORDER — DEXAMETHASONE SODIUM PHOSPHATE 10 MG/ML IJ SOLN
INTRAMUSCULAR | Status: DC | PRN
  Administered 2022-01-22: 10 mg via INTRAVENOUS

## 2022-01-22 MED ORDER — ACETAMINOPHEN 10 MG/ML IV SOLN
INTRAVENOUS | Status: DC | PRN
  Administered 2022-01-22: 1000 mg via INTRAVENOUS

## 2022-01-22 MED ORDER — MIDAZOLAM HCL 2 MG/2ML IJ SOLN
INTRAMUSCULAR | Status: AC
  Filled 2022-01-22: qty 2

## 2022-01-22 MED ORDER — LIDOCAINE HCL (PF) 2 % IJ SOLN
INTRAMUSCULAR | Status: AC
  Filled 2022-01-22: qty 5

## 2022-01-22 MED ORDER — DEXAMETHASONE SODIUM PHOSPHATE 10 MG/ML IJ SOLN
INTRAMUSCULAR | Status: AC
  Filled 2022-01-22: qty 1

## 2022-01-22 MED ORDER — 0.9 % SODIUM CHLORIDE (POUR BTL) OPTIME
TOPICAL | Status: DC | PRN
  Administered 2022-01-22: 500 mL

## 2022-01-22 MED ORDER — SUGAMMADEX SODIUM 200 MG/2ML IV SOLN
INTRAVENOUS | Status: DC | PRN
  Administered 2022-01-22: 100 mg via INTRAVENOUS
  Administered 2022-01-22: 200 mg via INTRAVENOUS

## 2022-01-22 MED ORDER — SUCCINYLCHOLINE CHLORIDE 200 MG/10ML IV SOSY
PREFILLED_SYRINGE | INTRAVENOUS | Status: AC
  Filled 2022-01-22: qty 10

## 2022-01-22 SURGICAL SUPPLY — 32 items
ADH SKN CLS APL DERMABOND .7 (GAUZE/BANDAGES/DRESSINGS) ×1
APL PRP STRL LF DISP 70% ISPRP (MISCELLANEOUS) ×1
BLADE SURG SZ11 CARB STEEL (BLADE) ×1 IMPLANT
CHLORAPREP W/TINT 26 (MISCELLANEOUS) ×1 IMPLANT
DERMABOND ADVANCED .7 DNX12 (GAUZE/BANDAGES/DRESSINGS) ×1 IMPLANT
DRAPE LAPAROTOMY 100X77 ABD (DRAPES) ×1 IMPLANT
GAUZE 4X4 16PLY ~~LOC~~+RFID DBL (SPONGE) ×1 IMPLANT
GLOVE BIO SURGEON STRL SZ 6.5 (GLOVE) ×1 IMPLANT
GLOVE SURG UNDER LTX SZ7 (GLOVE) ×1 IMPLANT
GOWN STRL REUS W/ TWL LRG LVL3 (GOWN DISPOSABLE) ×2 IMPLANT
GOWN STRL REUS W/TWL LRG LVL3 (GOWN DISPOSABLE) ×2
KIT TURNOVER CYSTO (KITS) ×1 IMPLANT
MANIFOLD NEPTUNE II (INSTRUMENTS) ×1 IMPLANT
NDL HYPO 25GX1X1/2 BEV (NEEDLE) ×1 IMPLANT
NEEDLE HYPO 25GX1X1/2 BEV (NEEDLE) ×1 IMPLANT
NS IRRIG 500ML POUR BTL (IV SOLUTION) ×1 IMPLANT
PACK BASIN MINOR ARMC (MISCELLANEOUS) ×1 IMPLANT
RETRACTOR RING XSMALL (MISCELLANEOUS) IMPLANT
RTRCTR WOUND ALEXIS 13CM XS SH (MISCELLANEOUS) ×1
SCRUB CHG 4% DYNA-HEX 4OZ (MISCELLANEOUS) ×1 IMPLANT
SUT MNCRL 4-0 (SUTURE) ×1
SUT MNCRL 4-0 27XMFL (SUTURE) ×1
SUT PLAIN GUT 0 (SUTURE) ×2 IMPLANT
SUT VIC AB 0 CT1 36 (SUTURE) IMPLANT
SUT VIC AB 0 SH 27 (SUTURE) IMPLANT
SUT VIC AB 3-0 SH 27 (SUTURE) ×1
SUT VIC AB 3-0 SH 27X BRD (SUTURE) ×1 IMPLANT
SUT VICRYL 0 UR6 27IN ABS (SUTURE) ×2 IMPLANT
SUTURE MNCRL 4-0 27XMF (SUTURE) ×1 IMPLANT
SYR 10ML LL (SYRINGE) ×1 IMPLANT
TRAP FLUID SMOKE EVACUATOR (MISCELLANEOUS) ×1 IMPLANT
WATER STERILE IRR 500ML POUR (IV SOLUTION) ×1 IMPLANT

## 2022-01-22 NOTE — Transfer of Care (Signed)
Immediate Anesthesia Transfer of Care Note  Patient: [name]  Procedure(s) Performed: POST PARTUM TUBAL LIGATION (Bilateral)  Patient Location: PACU  Anesthesia Type:General  Level of Consciousness: drowsy and patient cooperative  Airway & Oxygen Therapy: Patient Spontanous Breathing and Patient connected to face mask oxygen  Post-op Assessment: Report given to RN and Post -op Vital signs reviewed and stable  Post vital signs: Reviewed and stable  Last Vitals:  Vitals Value Taken Time  BP 115/83 01/22/22 1506  Temp 36.7 C 01/22/22 1506  Pulse 72 01/22/22 1511  Resp 22 01/22/22 1511  SpO2 98 % 01/22/22 1511  Vitals shown include unvalidated device data.  Last Pain:  Vitals:   01/22/22 1220  TempSrc: Temporal  PainSc: 0-No pain      Patients Stated Pain Goal: 0 (01/20/22 1515)  Complications: No notable events documented.

## 2022-01-22 NOTE — Anesthesia Procedure Notes (Signed)
Procedure Name: Intubation Date/Time: 01/22/2022 2:14 PM  Performed by: Mohammed Kindle, CRNAPre-anesthesia Checklist: Patient identified, Emergency Drugs available, Suction available and Patient being monitored Patient Re-evaluated:Patient Re-evaluated prior to induction Oxygen Delivery Method: Circle system utilized Preoxygenation: Pre-oxygenation with 100% oxygen Induction Type: IV induction Ventilation: Mask ventilation without difficulty Laryngoscope Size: McGraph and 3 Grade View: Grade I Tube type: Oral Tube size: 6.5 mm Number of attempts: 1 Airway Equipment and Method: Stylet and Oral airway Placement Confirmation: ETT inserted through vocal cords under direct vision, positive ETCO2, breath sounds checked- equal and bilateral and CO2 detector Secured at: 21 cm Tube secured with: Tape Dental Injury: Teeth and Oropharynx as per pre-operative assessment

## 2022-01-22 NOTE — Op Note (Signed)
Procedure(s): POST PARTUM TUBAL LIGATION Procedure Note  [name] female 31 y.o. 01/22/2022  Indications: The patient is a 31 y.o. G3P3104 female with undesired fertility, PPD#2 s/p SVD  Pre-operative Diagnosis: Multiparous, undesired fertility, PPD#2 s/p SVD  Post-operative Diagnosis: Same  Surgeon: Hildred Laser, MD  Assistants:  None.   Anesthesia: General endotracheal anesthesia  Findings: The uterus was palpable 2-3 finger breadths below umbilicus. Fallopian tubes and ovaries appeared normal.  Procedure Details: The patient was seen in the Holding Room. The risks, benefits, complications, treatment options, and expected outcomes were discussed with the patient.  The patient concurred with the proposed plan, giving informed consent.  The site of surgery properly noted/marked. The patient was taken to the Operating Room, identified as [name] and the procedure verified as Procedure(s) (LRB): POST PARTUM TUBAL LIGATION (Bilateral).  She was then placed under general anesthesia without difficulty. She was placed in the supine position, and was prepped and draped in a sterile manner.  After an adequate timeout was performed, attention was turned to the patient's abdomen local analgesia was administered (10 ml of 0.5% Sensorcaine).  A small transverse skin incision was made under the umbilical fold. The incision was taken down to the layer of fascia using the scalpel, and fascia was incised, and extended bilaterally using Mayo scissors. The peritoneum was entered in a sharp fashion. Attention was then turned to the patient's uterus, and left fallopian tube was identified and followed out to the fimbriated end. The Babcock clamp was then used to grasp the tube approximately 4 cm from the cornual region.  A 3 cm segment of tube was then ligated with a free tie of 0-Chromic using the [place] method and excised.  The right fallopian tube was then ligated in a similar fashion and  excised. The tubal lumens were cauterized bilaterally.  Good hemostasis was noted with bilateral fallopian tubes. The instruments were then removed from the patient's abdomen and the fascial incision was repaired with 0 Vicryl, and the skin was closed with a 4-0 Vicryl subcuticular stitch. The patient tolerated the procedure well.  Instrument, sponge, and needle counts were correct times two.  The patient was then taken to the recovery room awake and in stable condition.   Estimated Blood Loss:  10 ml      Drains: None. Patient voided prior to procedure         Total IV Fluids: 800 ml  Specimens: Segments of bilateral fallopian tubes          Implants: None         Complications:  None; patient tolerated the procedure well.         Disposition: PACU - hemodynamically stable.         Condition: stable   Hildred Laser, MD [facility]

## 2022-01-22 NOTE — Progress Notes (Signed)
OBSTETRICS AND GYNECOLOGY PRE-OPERATIVE NOTE   Pre-Op Diagnosis: Multiparity, desiring permanent sterilization  Planned Procedure: Postpartum tubal ligation  Surgeons: Hildred Laser, MD  Anesthesia: General  Blood: Type and screened  Labs:     Latest Ref Rng & Units 01/21/2022    5:26 AM 01/21/2022    2:09 AM 01/20/2022    4:20 PM  CBC  WBC 4.0 - 10.5 K/uL 16.7  17.8  13.5   Hemoglobin 12.0 - 15.0 g/dL 9.0  8.8  7.3   Hematocrit 36.0 - 46.0 % 25.4  24.7  20.9   Platelets 150 - 400 K/uL 244  233  179     Lab Results  Component Value Date   ABORH O NEG 01/20/2022    Antibiotics: None Needed   Consent: Signed and on chart   Pre-Op BTL Consent Consent: Surgical consent and tubal sterilization. Alternatives to sterilizations are documented on the surgical consent that has been signed by the patient. Patient confirms that she has been counseled about permanent sterilization on mutiple occasions during her antepartum course and during this admission. She understands the alternatives to permanents include: oral contraceptive pills, depot provera, patch, ring, intrauterine device (5 year or 10 year), Implanon, condoms and cervical caps/diaphram.  Risks of procedure discussed with patient including but not limited to: risk of regret, permanence of method, bleeding, infection, injury to surrounding organs and need for additional procedures.  Failure risk of about 1% with increased risk of ectopic gestation if pregnancy occurs was also discussed with patient.  Also discussed possibility of post-tubal pain syndrome. Patient verbalized understanding of these risks and wants to proceed with sterilization.  Written informed consent obtained.  To OR when ready.  Medicaid sterilization forms are compliant, signed 12/17/2021, in chart.   Hildred Laser, MD [facility] at [facility]

## 2022-01-22 NOTE — Anesthesia Preprocedure Evaluation (Signed)
Anesthesia Evaluation  Patient identified by MRN, date of birth, ID band Patient awake    Reviewed: Allergy & Precautions, NPO status , Patient's Chart, lab work & pertinent test results  Airway Mallampati: II  TM Distance: >3 FB Neck ROM: Full    Dental  (+) Teeth Intact   Pulmonary neg pulmonary ROS   Pulmonary exam normal breath sounds clear to auscultation       Cardiovascular Exercise Tolerance: Good negative cardio ROS Normal cardiovascular exam Rhythm:Regular     Neuro/Psych negative neurological ROS  negative psych ROS   GI/Hepatic negative GI ROS, Neg liver ROS,,,  Endo/Other  negative endocrine ROS    Renal/GU negative Renal ROS  negative genitourinary   Musculoskeletal negative musculoskeletal ROS (+)    Abdominal Normal abdominal exam  (+)   Peds negative pediatric ROS (+)  Hematology negative hematology ROS (+)   Anesthesia Other Findings Past Medical History: No date: Depression No date: HSV (herpes simplex virus) infection  Past Surgical History: 2014: FACIAL RECONSTRUCTION SURGERY  BMI    Body Mass Index: 25.64 kg/m      Reproductive/Obstetrics negative OB ROS                             Anesthesia Physical Anesthesia Plan  ASA: 2  Anesthesia Plan: General   Post-op Pain Management:    Induction: Intravenous  PONV Risk Score and Plan: 1 and Ondansetron and Dexamethasone  Airway Management Planned: Oral ETT  Additional Equipment:   Intra-op Plan:   Post-operative Plan: Extubation in OR  Informed Consent: I have reviewed the patients History and Physical, chart, labs and discussed the procedure including the risks, benefits and alternatives for the proposed anesthesia with the patient or authorized representative who has indicated his/her understanding and acceptance.     Dental Advisory Given  Plan Discussed with: CRNA and Surgeon  Anesthesia  Plan Comments:        Anesthesia Quick Evaluation

## 2022-01-23 ENCOUNTER — Encounter: Payer: Self-pay | Admitting: Obstetrics and Gynecology

## 2022-01-23 MED ORDER — ACETAMINOPHEN 325 MG PO TABS: 650.0000 mg | ORAL_TABLET | ORAL | Status: AC | PRN

## 2022-01-23 MED ORDER — LABETALOL HCL 100 MG PO TABS: 100.0000 mg | ORAL_TABLET | Freq: Two times a day (BID) | ORAL | 1 refills | Status: AC

## 2022-01-23 MED ORDER — SIMETHICONE 80 MG PO CHEW: 80.0000 mg | CHEWABLE_TABLET | ORAL | 0 refills | Status: DC | PRN

## 2022-01-23 MED ORDER — SERTRALINE HCL 50 MG PO TABS: 50.0000 mg | ORAL_TABLET | Freq: Every day | ORAL | 3 refills | Status: AC

## 2022-01-23 MED ORDER — LABETALOL HCL 100 MG PO TABS
100.0000 mg | ORAL_TABLET | Freq: Two times a day (BID) | ORAL | Status: DC
  Administered 2022-01-23: 100 mg via ORAL
  Filled 2022-01-23: qty 1

## 2022-01-23 MED ORDER — DOCUSATE SODIUM 100 MG PO CAPS: 100.0000 mg | ORAL_CAPSULE | Freq: Two times a day (BID) | ORAL | 0 refills | Status: DC | PRN

## 2022-01-23 MED ORDER — FERROUS SULFATE 325 (65 FE) MG PO TABS: 325.0000 mg | ORAL_TABLET | Freq: Two times a day (BID) | ORAL | 1 refills | Status: AC

## 2022-01-23 MED ORDER — OXYCODONE HCL 5 MG PO TABS: 5.0000 mg | ORAL_TABLET | ORAL | 0 refills | Status: DC | PRN

## 2022-01-23 MED ORDER — IBUPROFEN 600 MG PO TABS: 600.0000 mg | ORAL_TABLET | Freq: Four times a day (QID) | ORAL | 0 refills | Status: AC | PRN

## 2022-01-23 NOTE — Progress Notes (Signed)
Pt discharged. Infant in SCN.  Discharge instructions, prescriptions and follow up appointment given to and reviewed with pt. Pt verbalized understanding. Escorted out by auxillary. 

## 2022-01-26 ENCOUNTER — Other Ambulatory Visit: Payer: Medicaid Other

## 2022-01-26 ENCOUNTER — Ambulatory Visit (INDEPENDENT_AMBULATORY_CARE_PROVIDER_SITE_OTHER): Payer: Medicaid Other

## 2022-01-26 ENCOUNTER — Encounter: Payer: Medicaid Other | Admitting: Advanced Practice Midwife

## 2022-01-26 DIAGNOSIS — O165 Unspecified maternal hypertension, complicating the puerperium: Secondary | ICD-10-CM

## 2022-01-26 LAB — SURGICAL PATHOLOGY

## 2022-01-26 NOTE — Anesthesia Postprocedure Evaluation (Signed)
Anesthesia Post Note  Patient: [name]  Procedure(s) Performed: POST PARTUM TUBAL LIGATION (Bilateral)  Patient location during evaluation: PACU Anesthesia Type: General Level of consciousness: awake Pain management: pain level controlled Vital Signs Assessment: post-procedure vital signs reviewed and stable Respiratory status: nonlabored ventilation Cardiovascular status: stable Anesthetic complications: no  No notable events documented.   Last Vitals:  Vitals:   01/23/22 0038 01/23/22 0846  BP: (!) 155/82 138/73  Pulse: (!) 51 (!) 51  Resp: 20 18  Temp: 36.6 C 36.6 C  SpO2: 100% 100%    Last Pain:  Vitals:   01/23/22 1030  TempSrc:   PainSc: 7                  VAN STAVEREN,Johaan Ryser

## 2022-01-26 NOTE — Progress Notes (Cosign Needed Addendum)
Patient came in for a blood pressure check, first reading was 152/102 patient is well alert just very concerned and worried about her baby as he's in ICU at [facility]. She's ready to have her baby home with her family.  Spoke with Missy, wants patient to monitor her blood pressures at home if 140/90s to call our office. Comes back on the 28th to see Dr. Valentino Saxon  Repeat BP 140/72, pt denies HA, visual changes, and epigastric pain.

## 2022-02-02 NOTE — Progress Notes (Unsigned)
    OBSTETRICS/GYNECOLOGY POST-OPERATIVE CLINIC VISIT  Subjective:     [name] is a 31 y.o. female who presents to the clinic {1-10:13787} weeks status post POST PARTUM TUBAL LIGATION  for requested sterilization. Eating a regular diet {with-without:5700} difficulty. Bowel movements are {normal/abnormal***:19619}. {pain control:13522::"The patient is not having any pain."}  {Common ambulatory SmartLinks:19316}  Review of Systems {ros; complete:30496}   Objective:   There were no vitals taken for this visit. There is no height or weight on file to calculate BMI.  General:  alert and no distress  Abdomen: soft, bowel sounds active, non-tender  Incision:   {incision:13716::"no dehiscence","incision well approximated","healing well","no drainage","no erythema","no hernia","no seroma","no swelling"}    Pathology:    Assessment:   Patient s/p  POST PARTUM TUBAL LIGATION (surgery)  {doing well:13525::"Doing well postoperatively."}   Plan:   1. Continue any current medications as instructed by provider. 2. Wound care discussed. 3. Operative findings again reviewed. Pathology report discussed. 4. Activity restrictions: {restrictions:13723} 5. Anticipated return to work: {work return:14002}. 6. Follow up: {2-59:56387} {time; units:18646} for ***    Hildred Laser, MD [facility]

## 2022-02-03 ENCOUNTER — Ambulatory Visit (INDEPENDENT_AMBULATORY_CARE_PROVIDER_SITE_OTHER): Payer: Medicaid Other | Admitting: Obstetrics and Gynecology

## 2022-02-03 ENCOUNTER — Encounter: Payer: Self-pay | Admitting: Obstetrics and Gynecology

## 2022-02-03 VITALS — BP 130/95 | HR 75 | Resp 16 | Ht 65.0 in | Wt 137.8 lb

## 2022-02-03 DIAGNOSIS — Z09 Encounter for follow-up examination after completed treatment for conditions other than malignant neoplasm: Secondary | ICD-10-CM

## 2022-02-03 DIAGNOSIS — Z9851 Tubal ligation status: Secondary | ICD-10-CM

## 2022-02-03 DIAGNOSIS — Z1332 Encounter for screening for maternal depression: Secondary | ICD-10-CM

## 2022-03-02 ENCOUNTER — Other Ambulatory Visit: Payer: Self-pay | Admitting: Licensed Practical Nurse

## 2022-03-04 ENCOUNTER — Ambulatory Visit: Payer: Medicaid Other | Admitting: Obstetrics and Gynecology

## 2022-03-04 NOTE — Progress Notes (Deleted)
   OBSTETRICS POSTPARTUM CLINIC PROGRESS NOTE  Subjective:     [name] is a 31 y.o. [phone] female who presents for a postpartum visit. She is {1-10:13787} {time; units:18646} postpartum following a {delivery:12449}. I have fully reviewed the prenatal and intrapartum course. The delivery was at *** gestational weeks.  Anesthesia: {anesthesia types:812}. Postpartum course has been ***. Baby's course has been ***. Baby is feeding by {breast/bottle:69}. Bleeding: patient {HAS HAS UKG:25427} not resumed menses, with No LMP recorded.. Bowel function is {normal:32111}. Bladder function is {normal:32111}. Patient {is/is not:9024} sexually active. Contraception method desired is {contraceptive method:5051}. Postpartum depression screening: {neg default:13464::"negative"}.  EDPS score is ***.    The following portions of the patient's history were reviewed and updated as appropriate: allergies, current medications, past family history, past medical history, past social history, past surgical history, and problem list.  Review of Systems {ros; complete:30496}   Objective:    There were no vitals taken for this visit.  General:  alert and no distress   Breasts:  inspection negative, no nipple discharge or bleeding, no masses or nodularity palpable  Lungs: clear to auscultation bilaterally  Heart:  regular rate and rhythm, S1, S2 normal, no murmur, click, rub or gallop  Abdomen: soft, non-tender; bowel sounds normal; no masses,  no organomegaly.  ***Well healed Pfannenstiel incision   Vulva:  normal  Vagina: normal vagina, no discharge, exudate, lesion, or erythema  Cervix:  no cervical motion tenderness and no lesions  Corpus: normal size, contour, position, consistency, mobility, non-tender  Adnexa:  normal adnexa and no mass, fullness, tenderness  Rectal Exam: Not performed.         Labs:  Lab Results  Component Value Date   HGB 9.0 (L) 01/21/2022     Assessment:   No diagnosis  found.   Plan:    1. Contraception: {method:5051} 2. Will check Hgb for h/o postpartum anemia of less than 10.  3. Follow up in: {1-10:13787} {time; units:19136} or as needed.    Loney Laurence, CMA [facility]

## 2022-03-18 ENCOUNTER — Telehealth: Payer: Self-pay | Admitting: Obstetrics

## 2022-03-18 ENCOUNTER — Ambulatory Visit: Payer: Medicaid Other | Admitting: Obstetrics

## 2022-03-18 NOTE — Telephone Encounter (Signed)
Reached out to pt to reschedule PP visit that was scheduled with MMF on 03/18/22 at 2:15.  Left message for pt to call back.

## 2022-03-19 NOTE — Telephone Encounter (Signed)
Reached out to pt (2x) to reschedule PP visit that was scheduled with MMF on 03/18/2022 at 2:15.  Was able to reschedule appt for 04/01/2022 at 9:15 with MMF.

## 2022-04-01 ENCOUNTER — Telehealth: Payer: Self-pay | Admitting: Obstetrics

## 2022-04-01 ENCOUNTER — Ambulatory Visit: Payer: Medicaid Other | Admitting: Obstetrics

## 2022-04-01 NOTE — Telephone Encounter (Signed)
Reached out to pt to reschedule 6 wk pp visit that was scheduled with MMF on 04/01/2022 at 9:15.  Mailbox was full, could not leave a message.

## 2022-04-02 NOTE — Telephone Encounter (Signed)
Reached out to pt to reschedule 6 week pp visit that was scheduled with MMF on 04/01/2022 at 9:15.  Was able to reschedule the appt for 04/22/2022 at 10:35.

## 2022-04-22 ENCOUNTER — Telehealth: Payer: Self-pay | Admitting: Obstetrics

## 2022-04-22 ENCOUNTER — Ambulatory Visit: Payer: Medicaid Other | Admitting: Obstetrics

## 2022-04-22 NOTE — Telephone Encounter (Signed)
Reached out to pt to reschedule 6 week pp visit that was scheduled on 04/22/2022 at 10:35 with MMF.  Could not leave a message bc mailbox was full.

## 2022-04-23 ENCOUNTER — Encounter: Payer: Self-pay | Admitting: Obstetrics

## 2022-04-23 NOTE — Telephone Encounter (Signed)
Reached out to pt (2x) to reschedule 6 week pp visit that was scheduled on 04/22/2022 at 10:35 with MMF.  Could not leave a message, mailbox is full.  Will send a MyChart letter.

## 2022-04-29 ENCOUNTER — Encounter: Payer: Self-pay | Admitting: Obstetrics and Gynecology

## 2022-04-29 NOTE — Telephone Encounter (Signed)
The patient hasn't called back to be scheduled

## 2022-08-25 ENCOUNTER — Ambulatory Visit: Payer: Medicaid Other

## 2022-08-25 DIAGNOSIS — R399 Unspecified symptoms and signs involving the genitourinary system: Secondary | ICD-10-CM | POA: Insufficient documentation

## 2022-08-25 NOTE — Progress Notes (Deleted)
    NURSE VISIT NOTE  Subjective:    Patient ID: Hazeline Bachrach, female    DOB: 1990-10-28, 32 y.o.   MRN: [id number]       HPI  Patient is a 32 y.o. [id number] female who presents for {UTI Symptoms:210800002} for {0-10:33138} {TIME; UNITS DAY/WEEK/MONTH:19136}.  Patient denies {UTI Symptoms:210800002}.  Patient {does/does not:33181} have a history of recurrent UTI.  Patient {does/does not:33181} have a history of pyelonephritis.    Objective:    There were no vitals taken for this visit.   Lab Review  No results found for any visits on 08/25/22.  Assessment:   1. UTI symptoms      Plan:   {AOB UTI PLAN:28528:p}   Rocco Serene, LPN

## 2022-09-26 IMAGING — US US OB COMP LESS 14 WK
1 series · 14 of 28 positions shown · non-contrast
Comparison: None

CLINICAL DATA: First trimester pregnancy, size and dates, LMP
05/21/2020

EXAM:
OBSTETRIC <14 WK ULTRASOUND
TECHNIQUE: Transabdominal ultrasound was performed for evaluation of the
gestation as well as the maternal uterus and adnexal regions.

[Series 1: us ob less than 14 weeks with ob transvaginal · 14 of 78 slices shown]
[im 3/78]
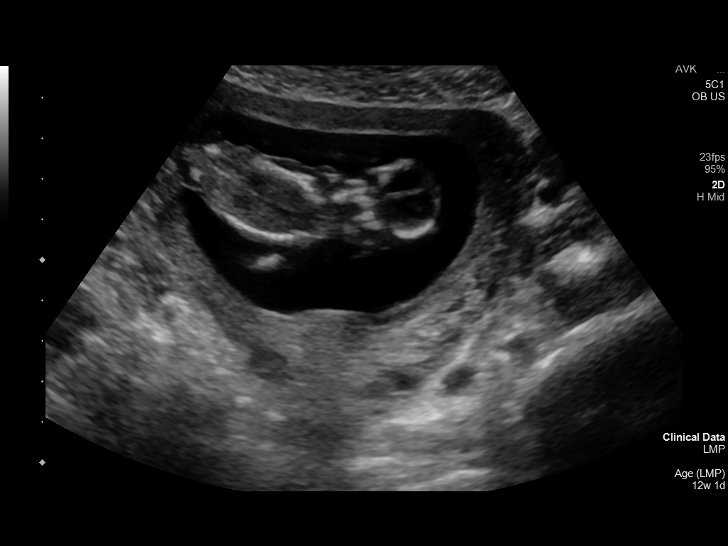
[im 9/78]
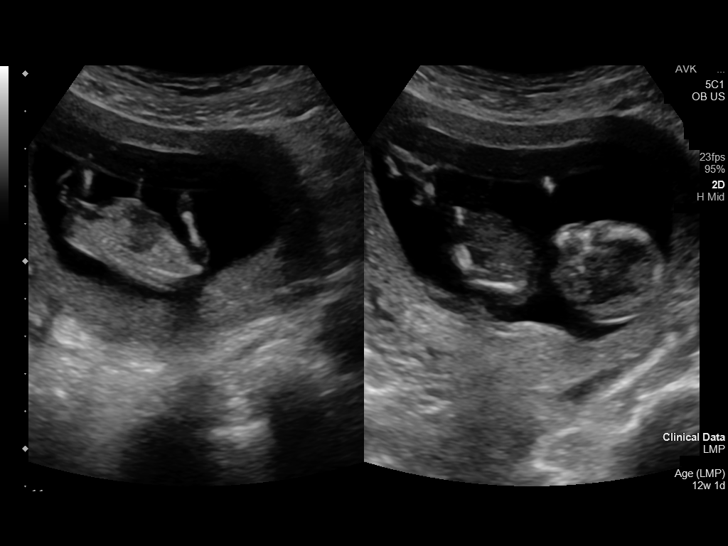
[im 15/78]
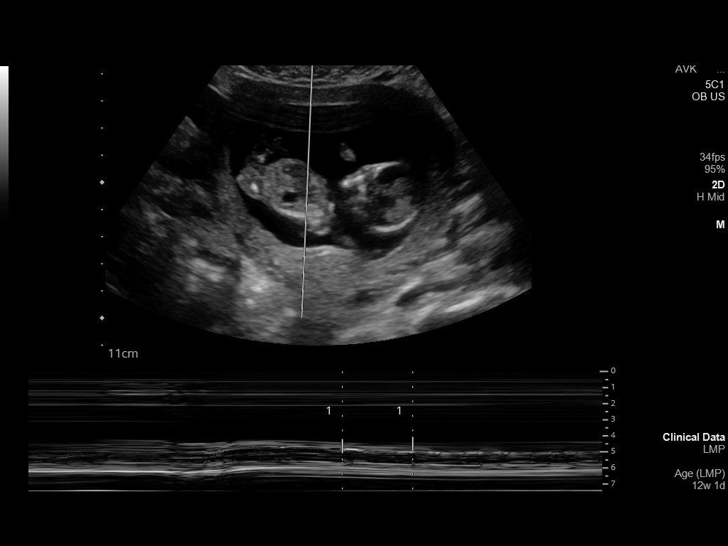
[im 20/78]
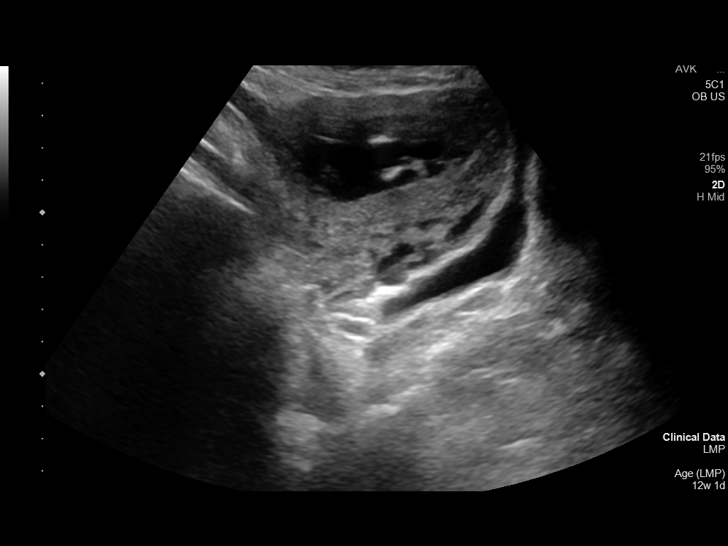
[im 26/78]
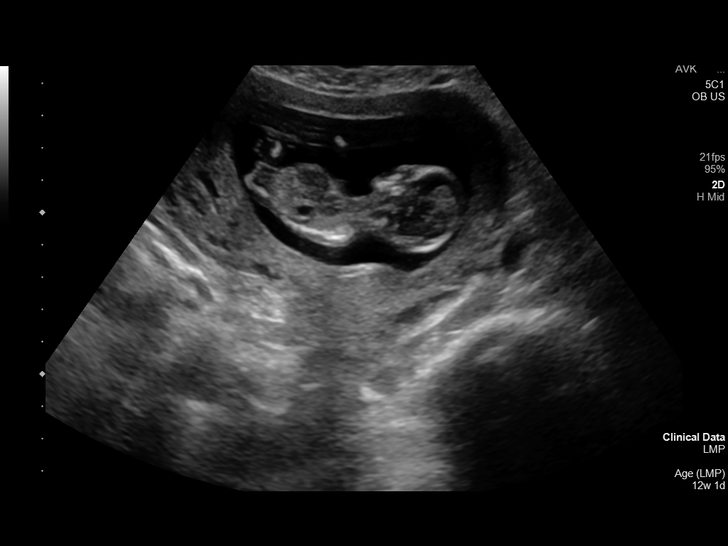
[im 32/78]
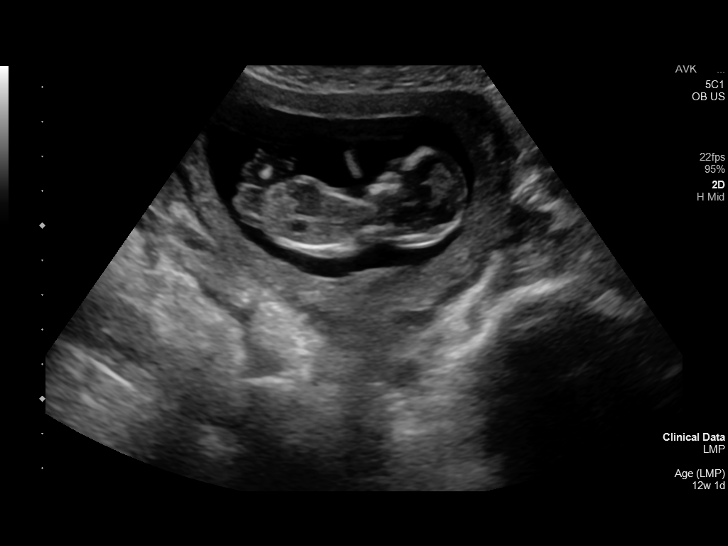
[im 38/78]
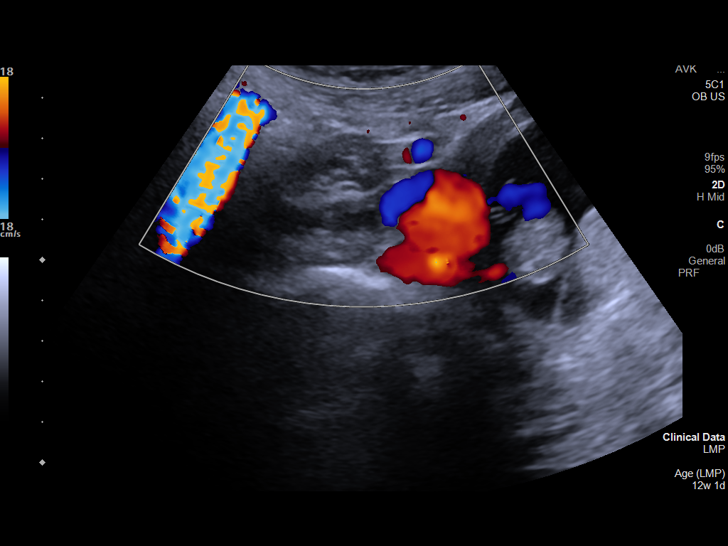
[im 43/78]
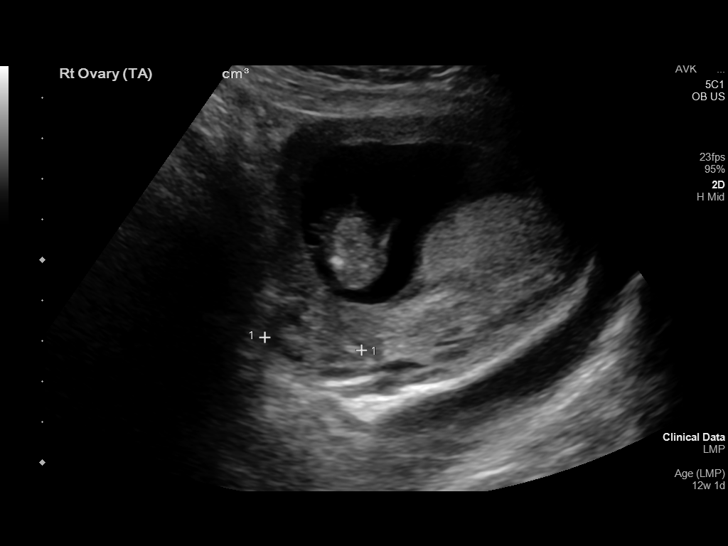
[im 49/78]
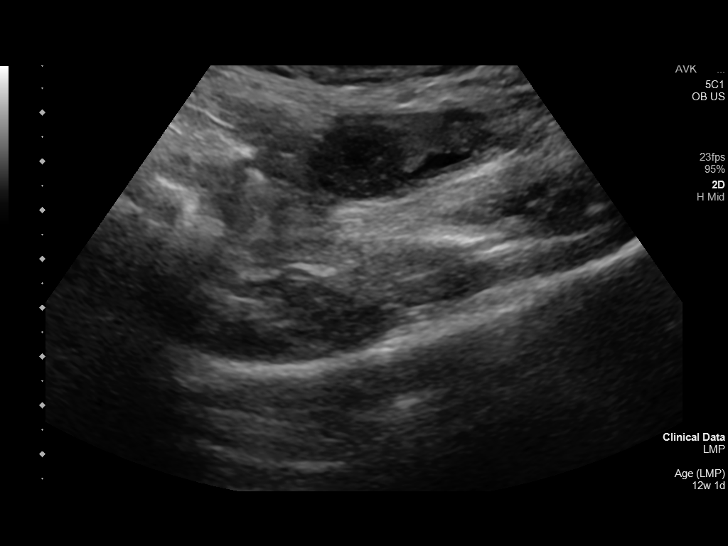
[im 55/78]
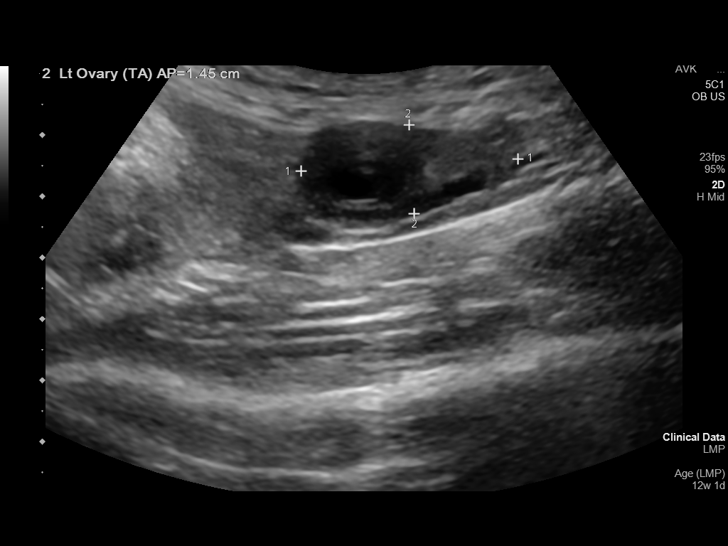
[im 60/78]
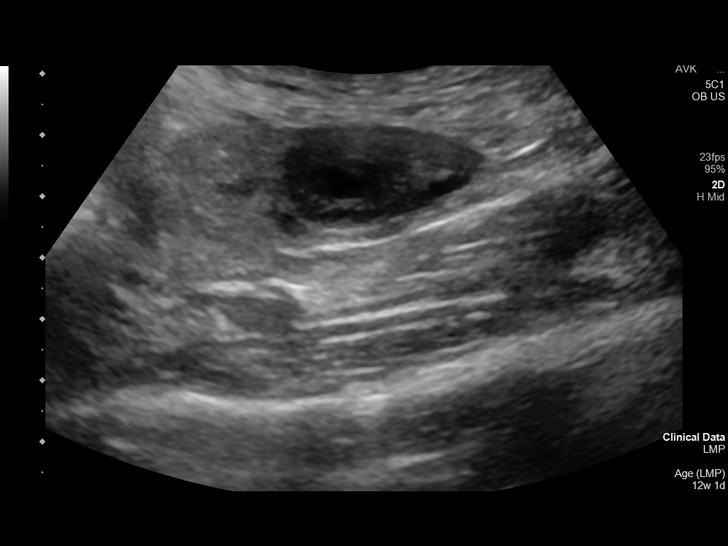
[im 66/78]
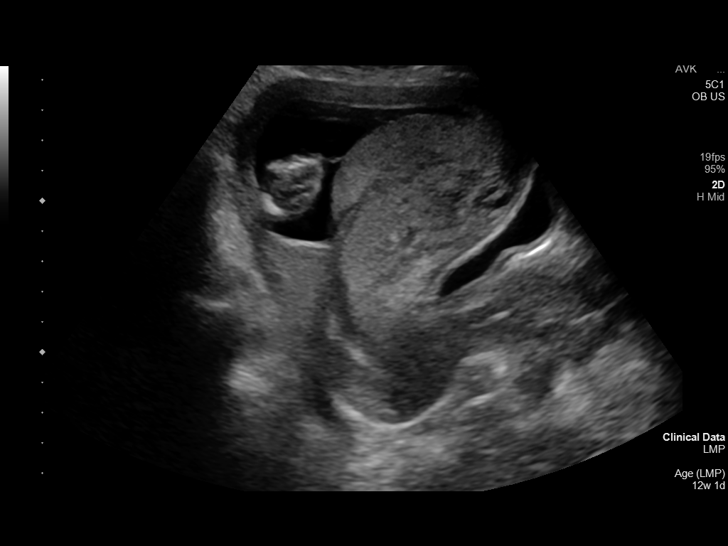
[im 72/78]
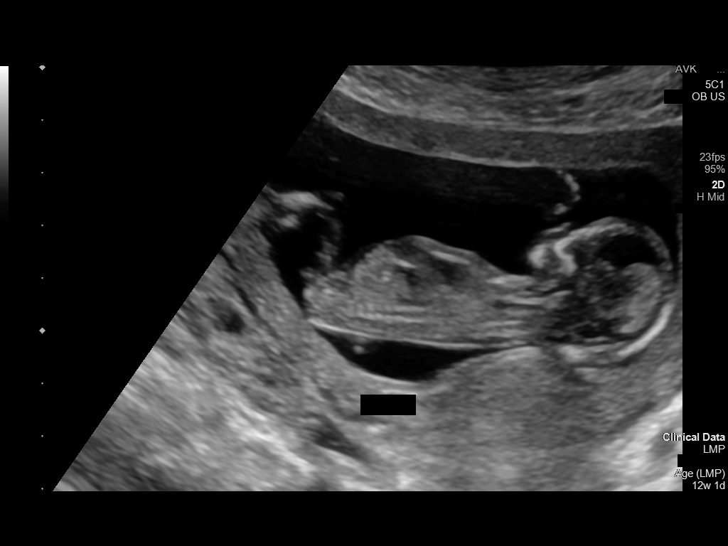
[im 78/78]
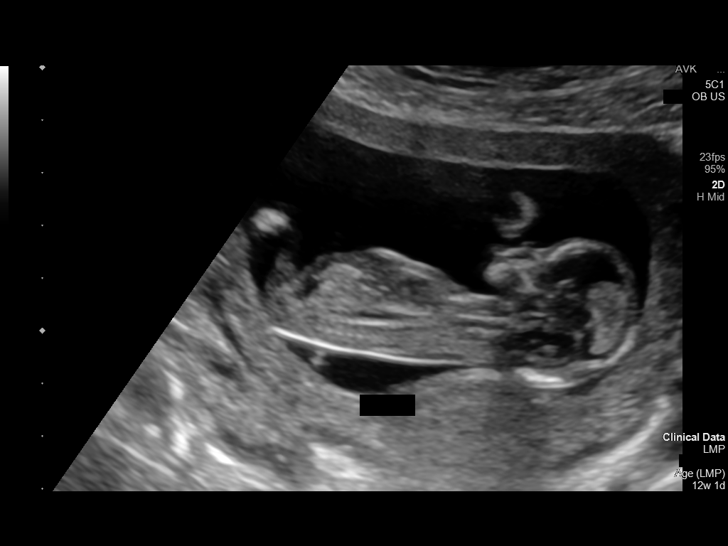

[14 of 28 positions shown; findings below may reference images not displayed]

FINDINGS: Intrauterine gestational sac: Present, single

Yolk sac:  Not identified

Embryo:  Present

Cardiac Activity: Present

Heart Rate: 145 bpm

CRL:   69.8 mm   13 w 1 d                  US EDC: 02/18/2021

Subchorionic hemorrhage:  None visualized.

Maternal uterus/adnexae:

Maternal uterus otherwise normal.

Ovaries unremarkable.

No free fluid or adnexal masses.
IMPRESSION: Single live intrauterine gestation at 13 weeks 1 day EGA by
crown-rump length.

No acute abnormalities.

## 2022-11-19 IMAGING — US US OB COMP +14 WK
1 series · 15 of 28 positions shown · non-contrast
Comparison: none

CLINICAL DATA: Second trimester pregnancy for fetal anatomy survey.

EXAM:
OBSTETRICAL ULTRASOUND >14 WKS

[Series 1: us ob comp + 14 wk · 15 of 105 slices shown]
[im 1/105]
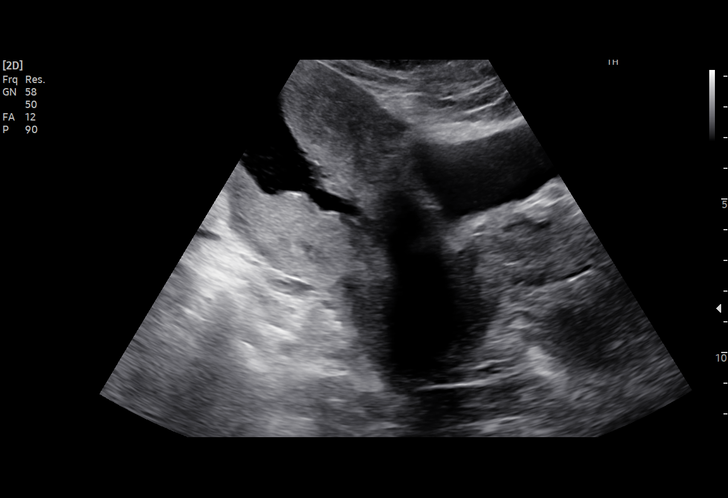
[im 8/105]
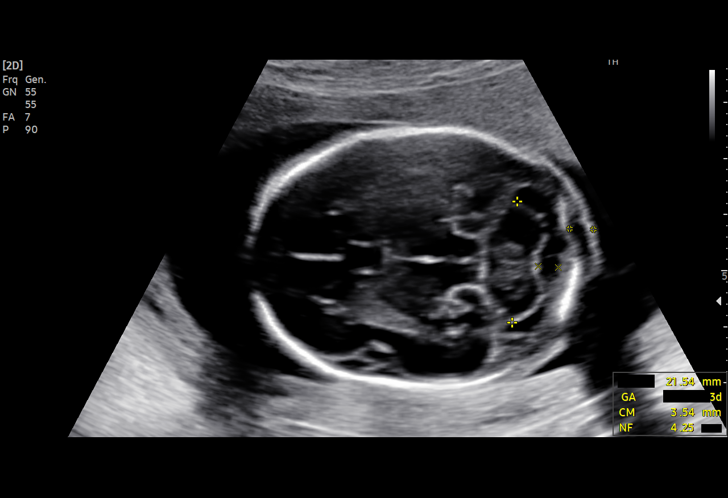
[im 16/105]
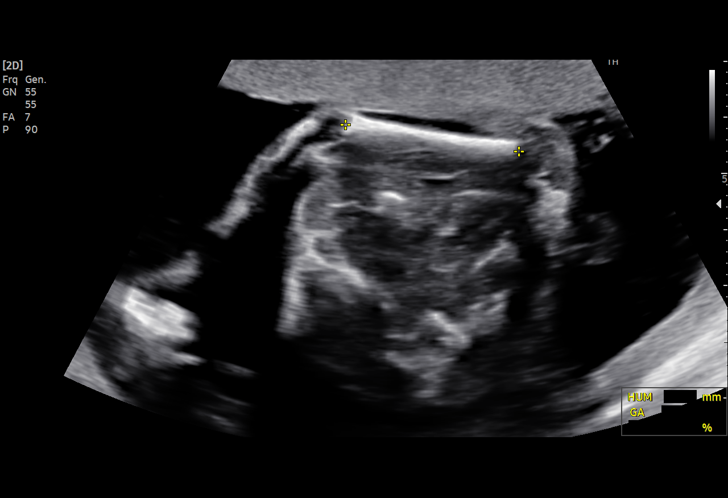
[im 24/105]
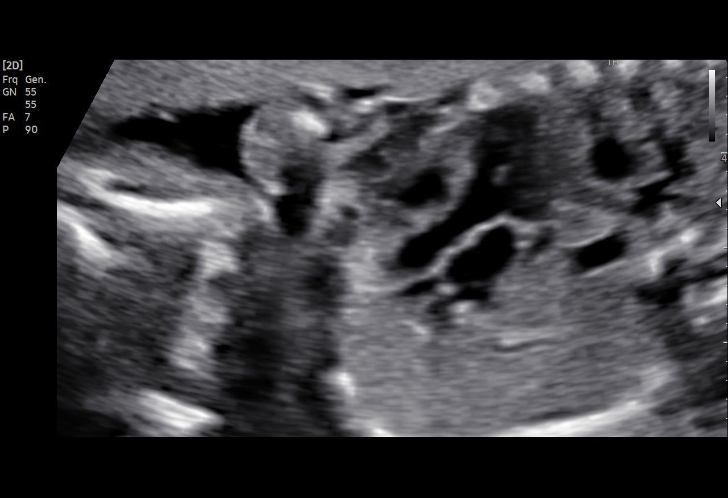
[im 31/105]
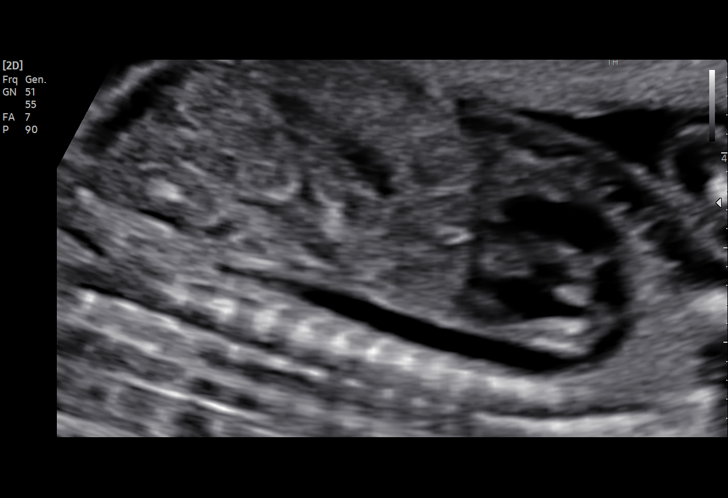
[im 39/105]
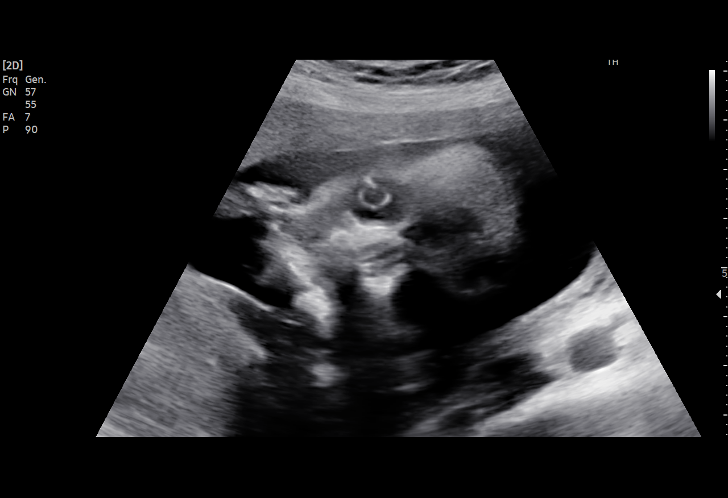
[im 47/105]
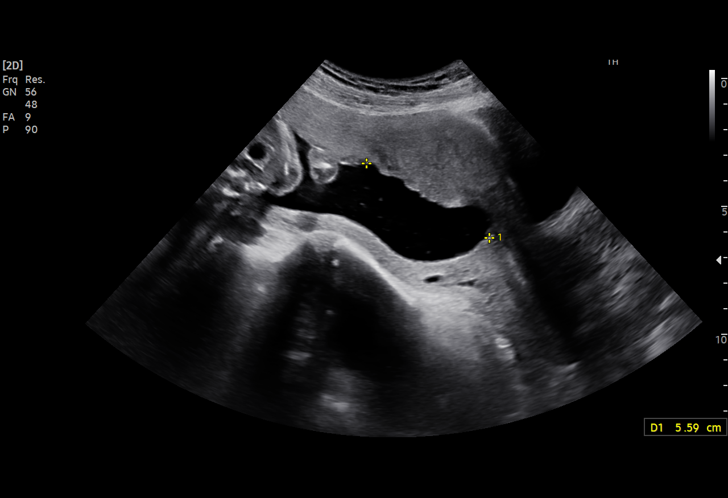
[im 54/105]
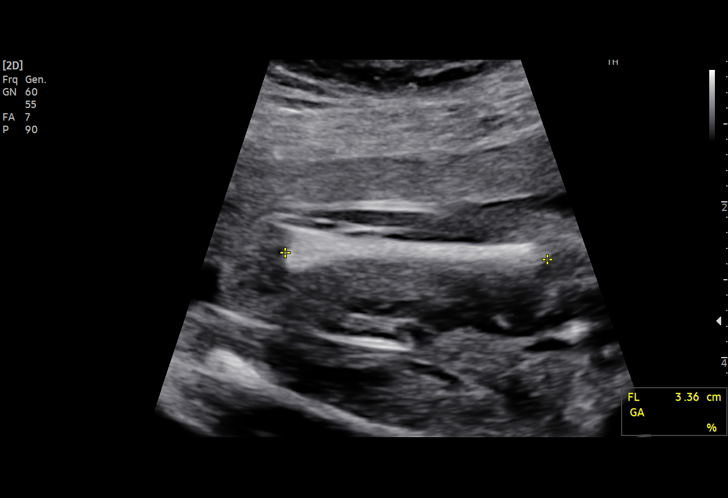
[im 58/105]
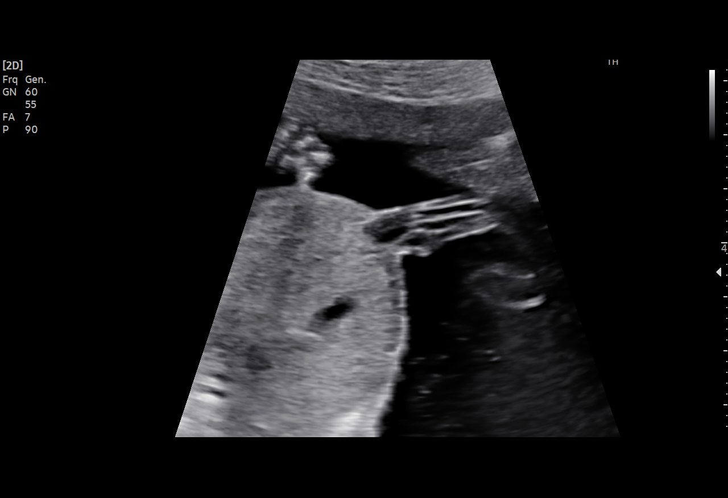
[im 66/105]
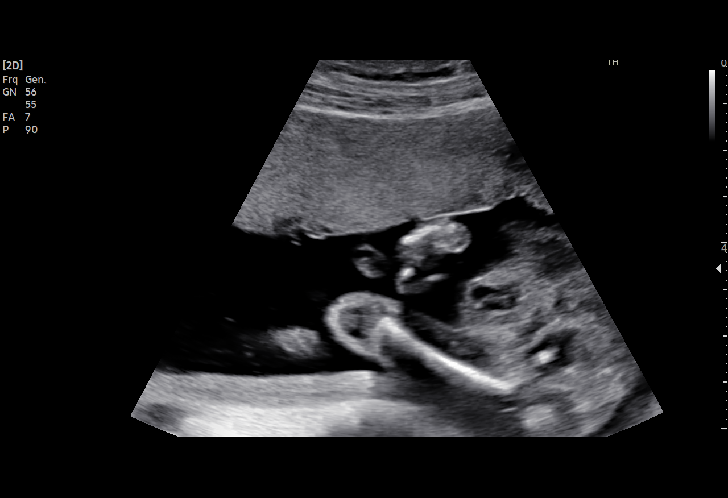
[im 74/105]
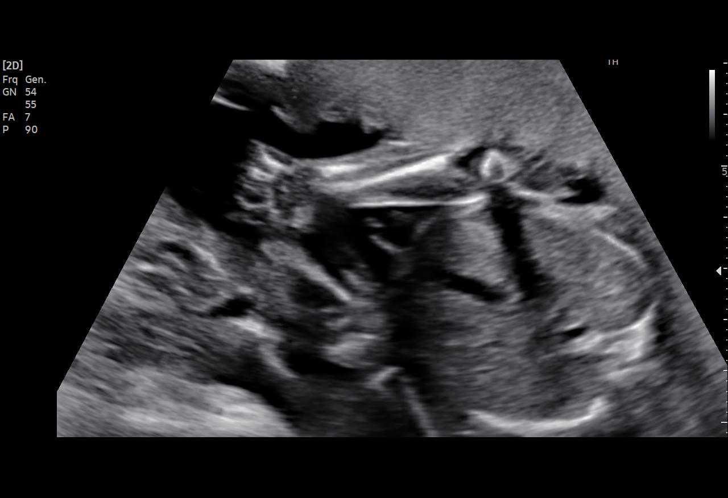
[im 81/105]
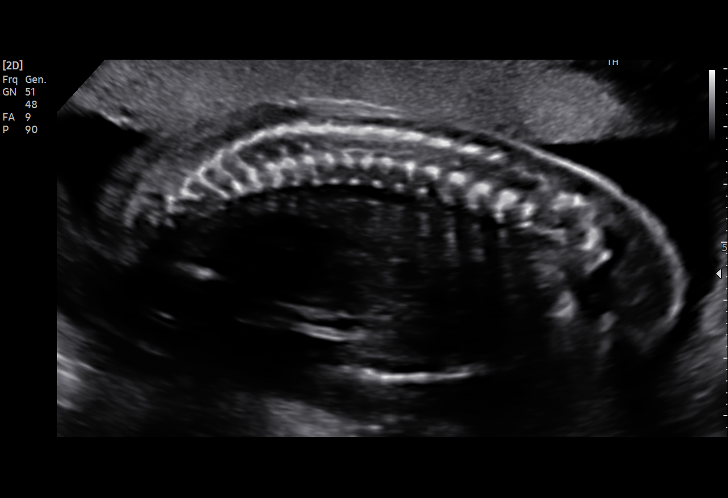
[im 89/105]
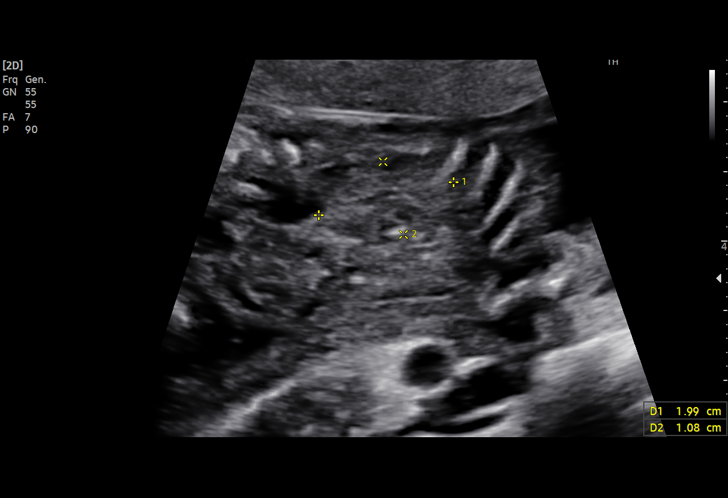
[im 97/105]
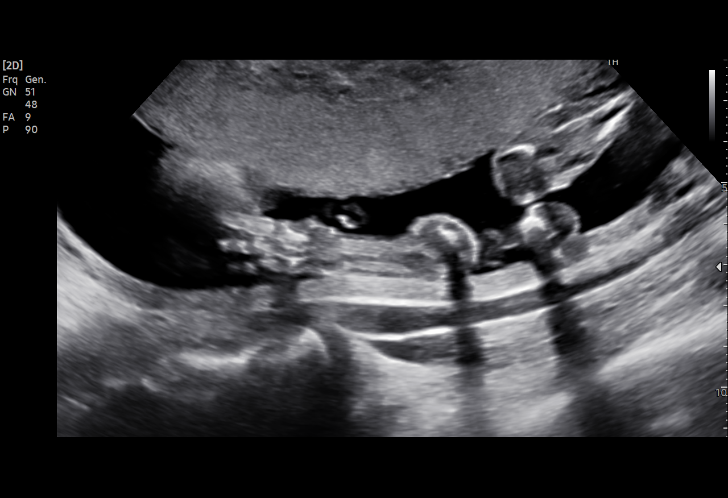
[im 105/105]
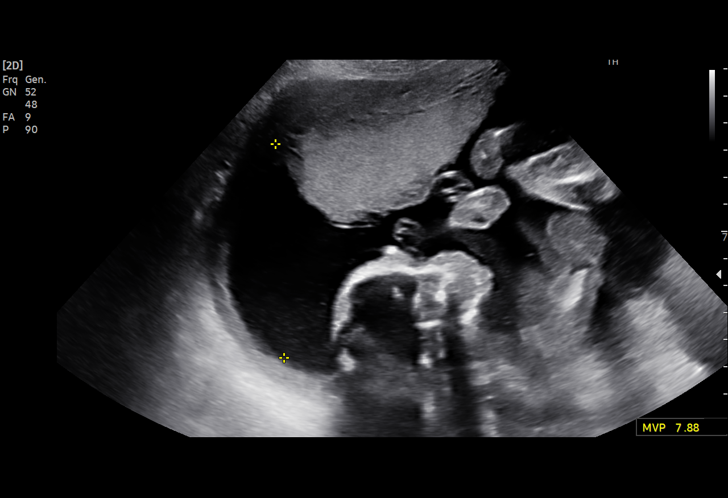

[15 of 28 positions shown; findings below may reference images not displayed]

FINDINGS: Number of Fetuses: 1

Heart Rate:  131 bpm

Movement: Yes

Presentation: Transverse lie with head to maternal right

Previa: No

Placental Location: Anterior

Amniotic Fluid (Subjective): Within normal limits

Amniotic Fluid (Objective):

Vertical pocket = 7.8cm

FETAL BIOMETRY

BPD: 4.6cm 20w 0d

HC:   17.8cm 20w 2d

AC:   15.1cm 20w 2d

FL:   3.3cm 20w 2d

Current Mean GA: 20w 2d US EDC: 02/22/2021

Assigned GA:  20w 6d Assigned EDC: 02/18/2021

FETAL ANATOMY

Lateral Ventricles: Appears normal

Thalami/CSP: Appears normal

Posterior Fossa:  Appears normal

Nuchal Region: Appears normal   NFT= N/A > 20 WKS

Upper Lip: Appears normal

Spine: Appears normal

4 Chamber Heart on Left: Appears normal; echogenic intracardiac
focus noted in left ventricle

LVOT: Appears normal

RVOT: Appears normal

Stomach on Left: Appears normal

3 Vessel Cord: Appears normal

Cord Insertion site: Appears normal

Kidneys: Appears normal

Bladder: Appears normal

Extremities: Appears normal

Maternal Findings:

Cervix:  3.8 cm TA
IMPRESSION: Assigned GA currently 20 weeks 6 days.  Appropriate fetal growth.

No fetal anomalies identified.

Fetal echogenic intracardiac focus noted. This is considered a soft
sonographic marker for Trisomy 21, however, it has a low specificity
in the absence of associated fetal abnormalities. Recommend
correlation with results of antenatal screening testing if
performed, and consider genetic counseling if desired.

## 2022-12-21 IMAGING — US US OB FOLLOW-UP
1 series · 15 of 28 positions shown · non-contrast
Comparison: none

CLINICAL DATA: 30-year-old pregnant female presents for follow-up
of EIF.

EXAM:
OBSTETRIC 14+ WK ULTRASOUND FOLLOW-UP

[Series 1: us ob follow up · 15 of 44 slices shown]
[im 1/44]
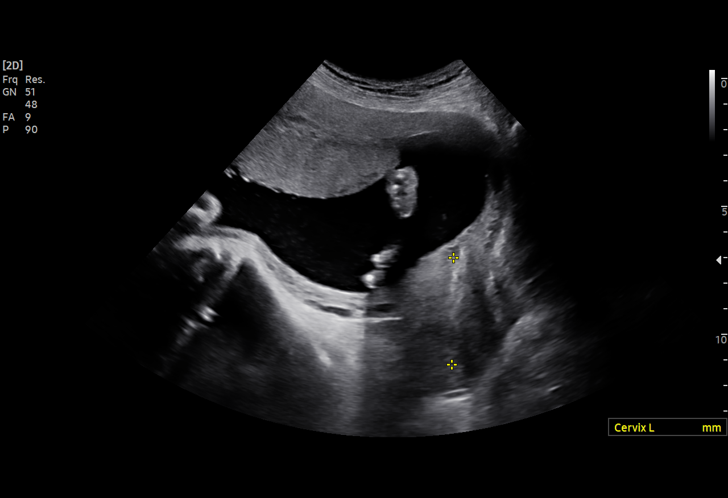
[im 4/44]
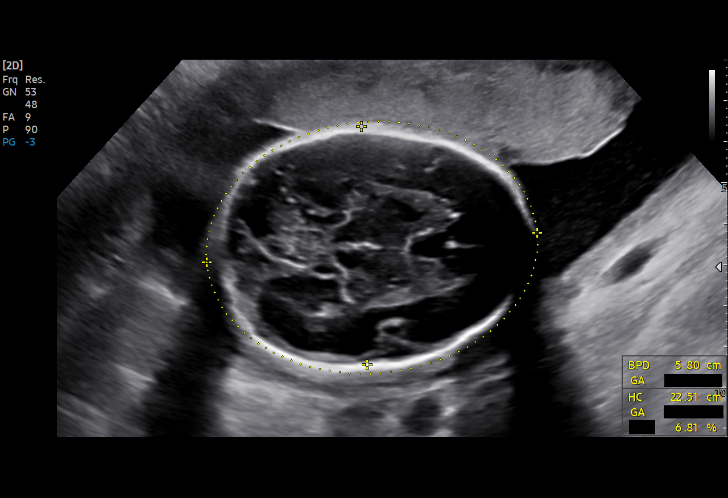
[im 7/44]
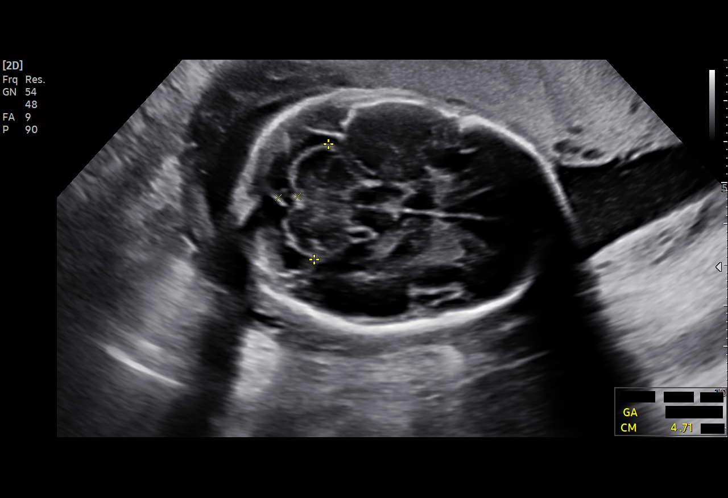
[im 10/44]
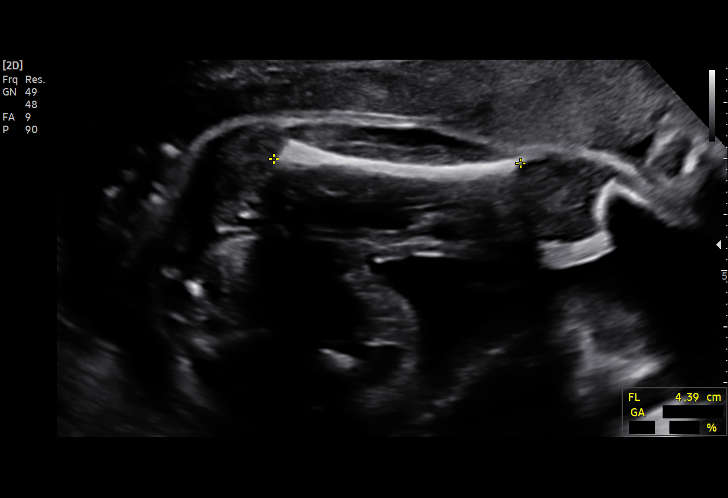
[im 13/44]
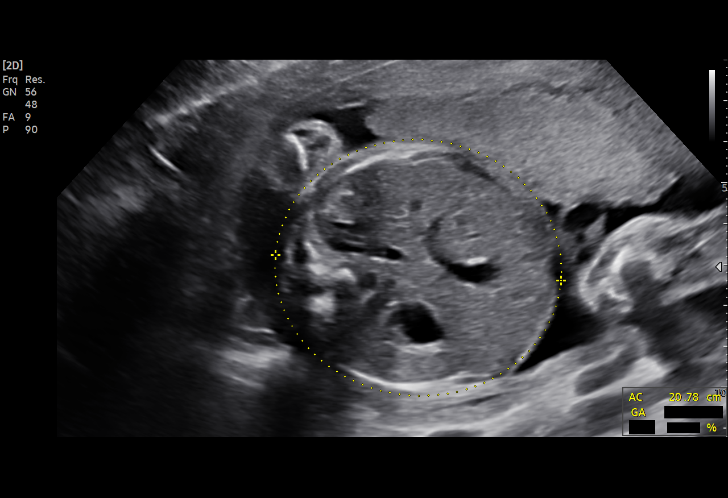
[im 16/44]
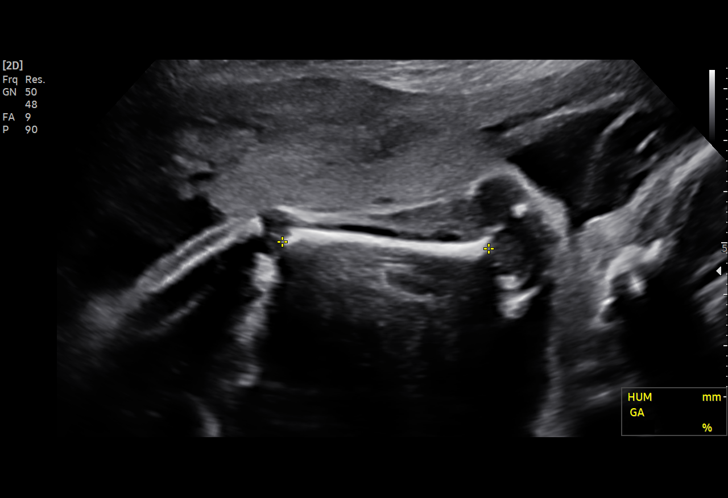
[im 20/44]
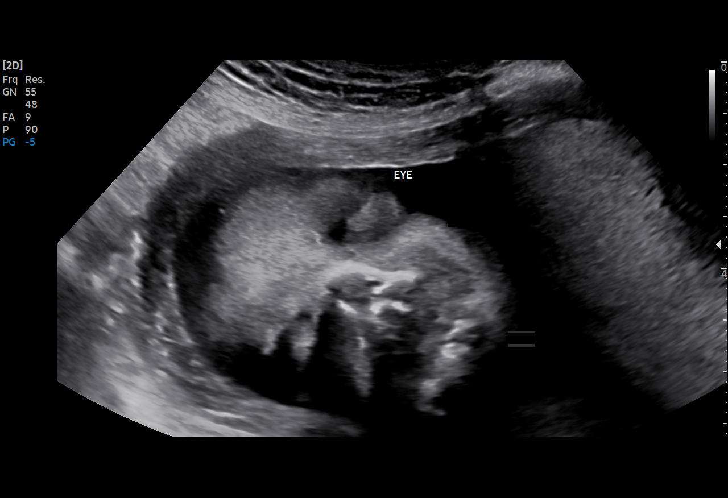
[im 23/44]
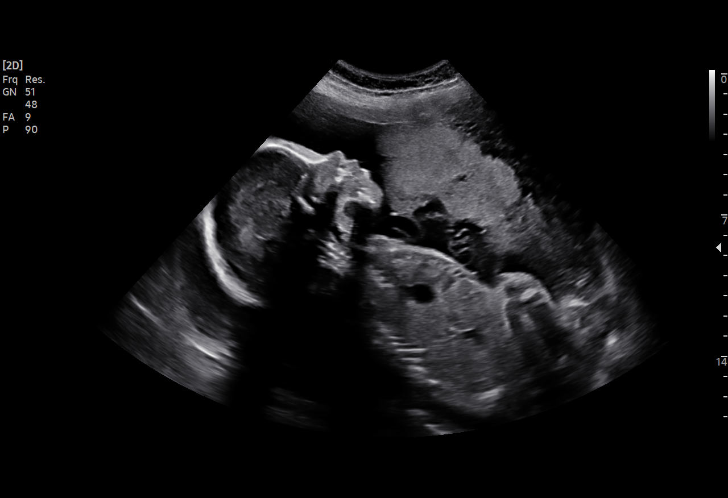
[im 24/44]
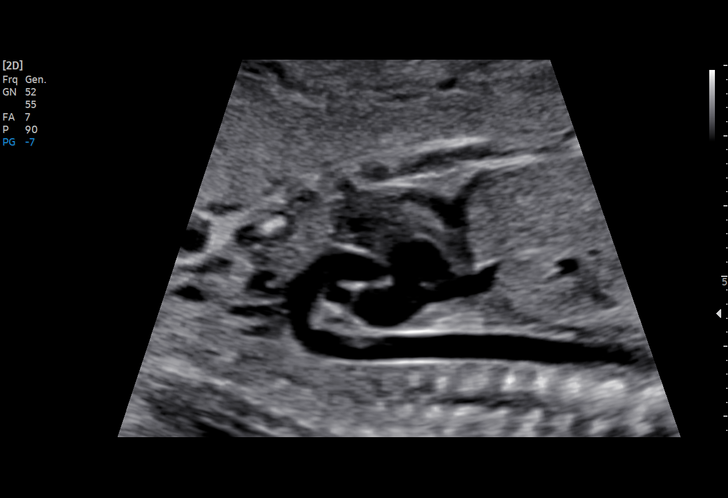
[im 28/44]
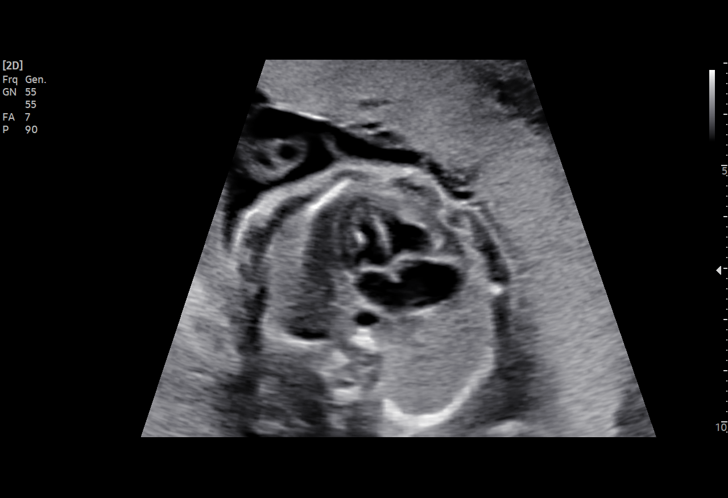
[im 31/44]
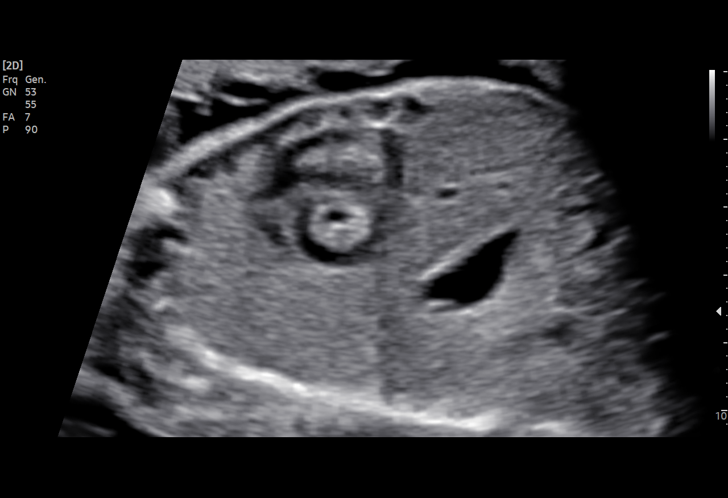
[im 34/44]
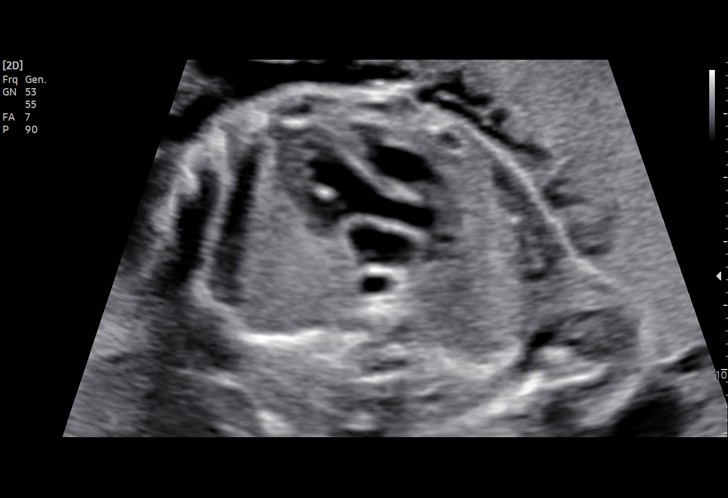
[im 37/44]
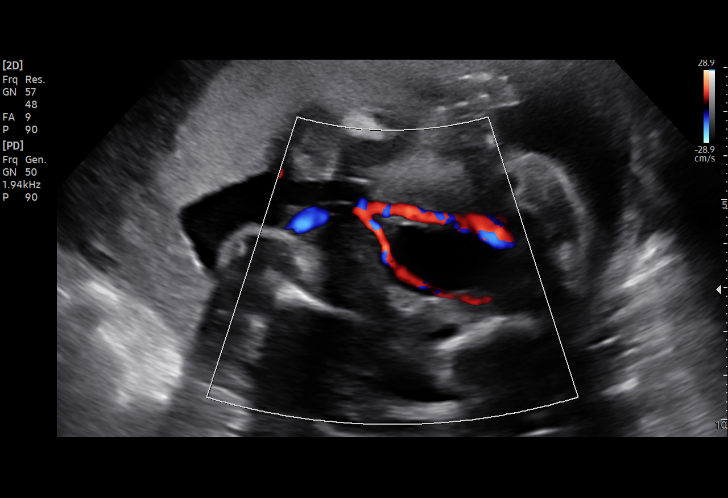
[im 40/44]
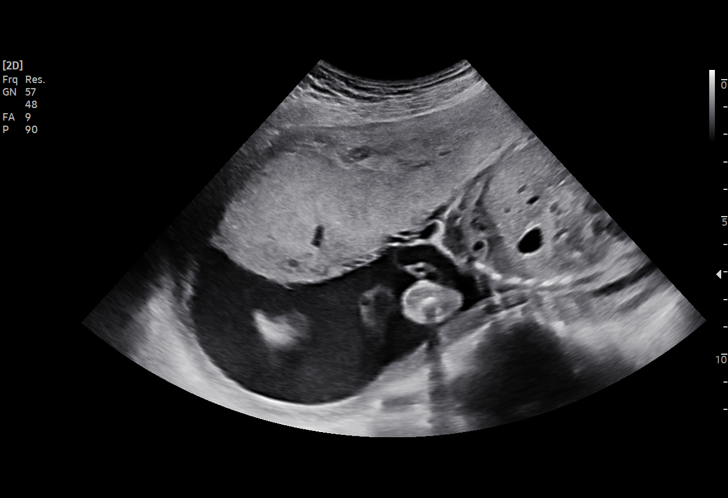
[im 44/44]
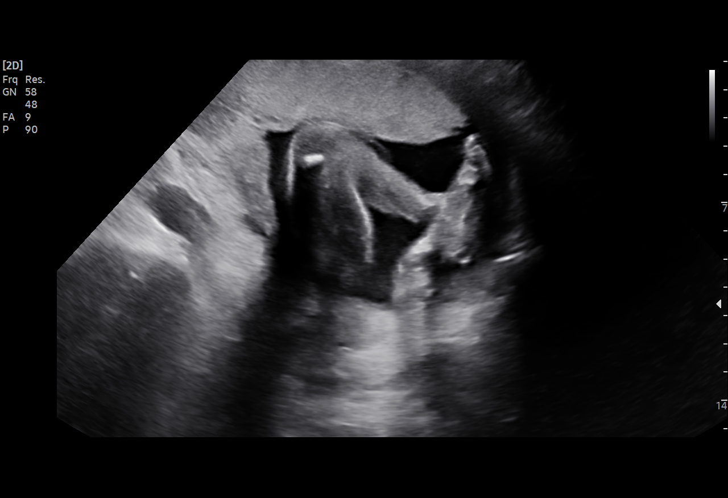

[15 of 28 positions shown; findings below may reference images not displayed]

FINDINGS: Number of Fetuses: 1

Heart Rate:  148 bpm

Movement: Yes

Presentation: Transverse with fetal head on maternal right

Previa: No

Placental Location: Anterior

Amniotic Fluid (Subjective): Normal

Amniotic Fluid (Objective):

Vertical pocket 6.2cm

FETAL BIOMETRY

BPD:  5.9cm 24w 0d

HC:    22.7cm 24w 5d

AC:    20.9cm 25w 3d

FL:    4.4cm 24w 3d

Current Mean GA: 24w 6d US EDC: 02/22/2021

Assigned GA: 25w 3d Assigned EDC: 02/18/2021

Estimated Fetal Weight:  750g 42%ile

FETAL ANATOMY

Lateral Ventricles: Appears normal

Thalami/CSP: Appears normal

Posterior Fossa: Appears normal

Nuchal Region: Appears normal

Upper Lip: Appears normal

Spine: Previously seen

4 Chamber Heart on Left: Solitary echogenic intracardiac focus in
the fetal left ventricle. Otherwise appears normal

LVOT: Appears normal

RVOT: Appears normal

Stomach on Left: Appears normal

3 Vessel Cord: Appears normal

Cord Insertion site: Previously seen

Kidneys: Appears normal

Bladder: Appears normal

Extremities: Previously seen

Sex: Previously Seen

Maternal Findings:

Cervix: Cervix length approximately 4.2 cm on transabdominal views
with no evidence of internal cervical funneling.
IMPRESSION: 1. Single living intrauterine gestation in transverse lie at 24
weeks 6 days by average ultrasound age, with appropriate interval
fetal growth.
2. Stable solitary echogenic intracardiac focus in the fetal left
ventricle, of limited prognostic value for aneuploidy as an isolated
finding. Otherwise no fetal or maternal abnormalities detected. As
previously noted, correlation with results of antenatal screening
testing suggested if performed.
